# Patient Record
Sex: Female | Born: 1975 | Race: White | Hispanic: No | Marital: Married | Smoking: Never smoker
Health system: Southern US, Community
[De-identification: ages and names within clinical notes are randomized; demographics above are authoritative.]

## PROBLEM LIST (undated history)

## (undated) DIAGNOSIS — F32A Depression, unspecified: Secondary | ICD-10-CM

## (undated) DIAGNOSIS — T7840XA Allergy, unspecified, initial encounter: Secondary | ICD-10-CM

## (undated) DIAGNOSIS — R079 Chest pain, unspecified: Secondary | ICD-10-CM

## (undated) DIAGNOSIS — M1990 Unspecified osteoarthritis, unspecified site: Secondary | ICD-10-CM

## (undated) DIAGNOSIS — R0609 Other forms of dyspnea: Secondary | ICD-10-CM

## (undated) DIAGNOSIS — B019 Varicella without complication: Secondary | ICD-10-CM

## (undated) DIAGNOSIS — R0600 Dyspnea, unspecified: Secondary | ICD-10-CM

## (undated) DIAGNOSIS — I341 Nonrheumatic mitral (valve) prolapse: Secondary | ICD-10-CM

## (undated) DIAGNOSIS — I471 Supraventricular tachycardia, unspecified: Secondary | ICD-10-CM

## (undated) DIAGNOSIS — J45909 Unspecified asthma, uncomplicated: Secondary | ICD-10-CM

## (undated) DIAGNOSIS — Q676 Pectus excavatum: Secondary | ICD-10-CM

## (undated) DIAGNOSIS — R011 Cardiac murmur, unspecified: Secondary | ICD-10-CM

## (undated) DIAGNOSIS — F329 Major depressive disorder, single episode, unspecified: Secondary | ICD-10-CM

## (undated) HISTORY — DX: Chest pain, unspecified: R07.9

## (undated) HISTORY — DX: Unspecified asthma, uncomplicated: J45.909

## (undated) HISTORY — DX: Dyspnea, unspecified: R06.00

## (undated) HISTORY — DX: Nonrheumatic mitral (valve) prolapse: I34.1

## (undated) HISTORY — DX: Cardiac murmur, unspecified: R01.1

## (undated) HISTORY — DX: Allergy, unspecified, initial encounter: T78.40XA

## (undated) HISTORY — DX: Other forms of dyspnea: R06.09

## (undated) HISTORY — DX: Supraventricular tachycardia: I47.1

## (undated) HISTORY — DX: Depression, unspecified: F32.A

## (undated) HISTORY — DX: Unspecified osteoarthritis, unspecified site: M19.90

## (undated) HISTORY — DX: Major depressive disorder, single episode, unspecified: F32.9

## (undated) HISTORY — DX: Supraventricular tachycardia, unspecified: I47.10

## (undated) HISTORY — DX: Varicella without complication: B01.9

## (undated) HISTORY — DX: Pectus excavatum: Q67.6

## (undated) HISTORY — PX: SHOULDER SURGERY: SHX246

---

## 1998-08-08 HISTORY — PX: FRACTURE SURGERY: SHX138

## 2012-09-30 LAB — HM PAP SMEAR: HM Pap smear: NORMAL

## 2012-12-09 DIAGNOSIS — J45909 Unspecified asthma, uncomplicated: Secondary | ICD-10-CM | POA: Insufficient documentation

## 2013-09-23 DIAGNOSIS — M19011 Primary osteoarthritis, right shoulder: Secondary | ICD-10-CM | POA: Insufficient documentation

## 2015-08-18 ENCOUNTER — Ambulatory Visit (INDEPENDENT_AMBULATORY_CARE_PROVIDER_SITE_OTHER): Payer: 59 | Admitting: Internal Medicine

## 2015-08-18 ENCOUNTER — Encounter: Payer: Self-pay | Admitting: Internal Medicine

## 2015-08-18 VITALS — BP 104/68 | HR 69 | Temp 98.5°F | Resp 12 | Ht 64.75 in | Wt 139.5 lb

## 2015-08-18 DIAGNOSIS — Z8659 Personal history of other mental and behavioral disorders: Secondary | ICD-10-CM

## 2015-08-18 DIAGNOSIS — E559 Vitamin D deficiency, unspecified: Secondary | ICD-10-CM | POA: Diagnosis not present

## 2015-08-18 DIAGNOSIS — M25511 Pain in right shoulder: Secondary | ICD-10-CM

## 2015-08-18 DIAGNOSIS — Z975 Presence of (intrauterine) contraceptive device: Secondary | ICD-10-CM | POA: Diagnosis not present

## 2015-08-18 DIAGNOSIS — G8929 Other chronic pain: Secondary | ICD-10-CM

## 2015-08-18 DIAGNOSIS — Z8679 Personal history of other diseases of the circulatory system: Secondary | ICD-10-CM | POA: Diagnosis not present

## 2015-08-18 DIAGNOSIS — R5383 Other fatigue: Secondary | ICD-10-CM

## 2015-08-18 DIAGNOSIS — I341 Nonrheumatic mitral (valve) prolapse: Secondary | ICD-10-CM | POA: Diagnosis not present

## 2015-08-18 DIAGNOSIS — E785 Hyperlipidemia, unspecified: Secondary | ICD-10-CM

## 2015-08-18 DIAGNOSIS — Q676 Pectus excavatum: Secondary | ICD-10-CM
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
## 2015-08-18 NOTE — Patient Instructions (Signed)
It was great to meet you!  I'll see  you in a few weeks for your annual CPE with PAP smear  We'll get your fasting labs done prior to visit  And schedule the mammogram afterward

## 2015-08-18 NOTE — Progress Notes (Signed)
Pre-visit discussion using our clinic review tool. No additional management support is needed unless otherwise documented below in the visit note.  

## 2015-08-21 DIAGNOSIS — R5383 Other fatigue: Secondary | ICD-10-CM | POA: Insufficient documentation

## 2015-08-21 DIAGNOSIS — M25511 Pain in right shoulder: Secondary | ICD-10-CM

## 2015-08-21 DIAGNOSIS — E559 Vitamin D deficiency, unspecified: Secondary | ICD-10-CM | POA: Insufficient documentation

## 2015-08-21 DIAGNOSIS — E785 Hyperlipidemia, unspecified: Secondary | ICD-10-CM | POA: Insufficient documentation

## 2015-08-21 DIAGNOSIS — Z8659 Personal history of other mental and behavioral disorders: Secondary | ICD-10-CM | POA: Insufficient documentation

## 2015-08-21 DIAGNOSIS — Q676 Pectus excavatum: Secondary | ICD-10-CM | POA: Insufficient documentation

## 2015-08-21 DIAGNOSIS — G8929 Other chronic pain: Secondary | ICD-10-CM | POA: Insufficient documentation

## 2015-08-21 NOTE — Assessment & Plan Note (Addendum)
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  Continue celebrex , turmeric and tylenol

## 2015-08-21 NOTE — Assessment & Plan Note (Signed)
It is unclear what the best way to image her right breast for breast cancer screening is. Will discuss at her annual exam.

## 2015-08-21 NOTE — Assessment & Plan Note (Addendum)
Chronic, managed for years with fluoxetine. No changes today

## 2015-08-21 NOTE — Assessment & Plan Note (Signed)
She has no murmur on exam today.

## 2015-09-29 ENCOUNTER — Other Ambulatory Visit
Admission: RE | Admit: 2015-09-29 | Discharge: 2015-09-29 | Disposition: A | Discharge status: Home / Self Care | Payer: 59 | Source: Ambulatory Visit | Attending: Internal Medicine | Admitting: Internal Medicine

## 2015-09-29 DIAGNOSIS — E785 Hyperlipidemia, unspecified: Secondary | ICD-10-CM | POA: Diagnosis not present

## 2015-09-29 DIAGNOSIS — R5383 Other fatigue: Secondary | ICD-10-CM | POA: Insufficient documentation

## 2015-09-29 DIAGNOSIS — E559 Vitamin D deficiency, unspecified: Secondary | ICD-10-CM | POA: Insufficient documentation

## 2015-09-29 LAB — COMPREHENSIVE METABOLIC PANEL
ALBUMIN: 4.2 g/dL (ref 3.5–5.0)
ALT: 25 U/L (ref 14–54)
ANION GAP: 3 — AB (ref 5–15)
AST: 23 U/L (ref 15–41)
Alkaline Phosphatase: 57 U/L (ref 38–126)
BUN: 17 mg/dL (ref 6–20)
CHLORIDE: 107 mmol/L (ref 101–111)
CO2: 30 mmol/L (ref 22–32)
CREATININE: 0.72 mg/dL (ref 0.44–1.00)
Calcium: 9.1 mg/dL (ref 8.9–10.3)
GFR calc non Af Amer: >60 mL/min (ref >60)
Glucose, Bld: 96 mg/dL (ref 65–99)
Potassium: 4.9 mmol/L (ref 3.5–5.1)
SODIUM: 140 mmol/L (ref 135–145)
Total Bilirubin: 1.4 mg/dL — ABNORMAL HIGH (ref 0.3–1.2)
Total Protein: 7.2 g/dL (ref 6.5–8.1)

## 2015-09-29 LAB — CBC WITH DIFFERENTIAL/PLATELET
Basophils Absolute: 0 10*3/uL (ref 0–0.1)
Basophils Relative: 1 %
Eosinophils Absolute: 0.1 10*3/uL (ref 0–0.7)
Eosinophils Relative: 3 %
HCT: 44.1 % (ref 35.0–47.0)
HEMOGLOBIN: 15.3 g/dL (ref 12.0–16.0)
LYMPHS ABS: 1.2 10*3/uL (ref 1.0–3.6)
Lymphocytes Relative: 22 %
MCH: 31.1 pg (ref 26.0–34.0)
MCHC: 34.7 g/dL (ref 32.0–36.0)
MCV: 89.8 fL (ref 80.0–100.0)
MONOS PCT: 7 %
Monocytes Absolute: 0.4 10*3/uL (ref 0.2–0.9)
NEUTROS PCT: 67 %
Neutro Abs: 3.7 10*3/uL (ref 1.4–6.5)
Platelets: 175 10*3/uL (ref 150–440)
RBC: 4.92 MIL/uL (ref 3.80–5.20)
RDW: 11.9 % (ref 11.5–14.5)
WBC: 5.5 10*3/uL (ref 3.6–11.0)

## 2015-09-29 LAB — LIPID PANEL
Cholesterol: 162 mg/dL (ref 0–200)
HDL: 51 mg/dL (ref >40)
LDL CALC: 102 mg/dL — AB (ref 0–99)
Total CHOL/HDL Ratio: 3.2 RATIO
Triglycerides: 46 mg/dL (ref <150)
VLDL: 9 mg/dL (ref 0–40)

## 2015-09-29 LAB — TSH: TSH: 1.74 u[IU]/mL (ref 0.350–4.500)

## 2015-09-30 LAB — VITAMIN D 25 HYDROXY (VIT D DEFICIENCY, FRACTURES): Vit D, 25-Hydroxy: 36.1 ng/mL (ref 30.0–100.0)

## 2015-09-30 LAB — T4: T4, Total: 7.7 ug/dL (ref 4.5–12.0)

## 2015-10-01 ENCOUNTER — Encounter: Payer: Self-pay | Admitting: Internal Medicine

## 2015-10-01 ENCOUNTER — Ambulatory Visit (INDEPENDENT_AMBULATORY_CARE_PROVIDER_SITE_OTHER): Payer: 59 | Admitting: Internal Medicine

## 2015-10-01 ENCOUNTER — Other Ambulatory Visit (HOSPITAL_COMMUNITY)
Admission: RE | Admit: 2015-10-01 | Discharge: 2015-10-01 | Disposition: A | Discharge status: Home / Self Care | Payer: 59 | Source: Ambulatory Visit | Attending: Internal Medicine | Admitting: Internal Medicine

## 2015-10-01 VITALS — BP 96/68 | HR 74 | Temp 98.1°F | Resp 12 | Ht 65.0 in | Wt 139.5 lb

## 2015-10-01 DIAGNOSIS — Z803 Family history of malignant neoplasm of breast: Secondary | ICD-10-CM | POA: Diagnosis not present

## 2015-10-01 DIAGNOSIS — Z124 Encounter for screening for malignant neoplasm of cervix: Secondary | ICD-10-CM

## 2015-10-01 DIAGNOSIS — Z1151 Encounter for screening for human papillomavirus (HPV): Secondary | ICD-10-CM | POA: Diagnosis not present

## 2015-10-01 DIAGNOSIS — Z872 Personal history of diseases of the skin and subcutaneous tissue: Secondary | ICD-10-CM | POA: Diagnosis not present

## 2015-10-01 DIAGNOSIS — Z01419 Encounter for gynecological examination (general) (routine) without abnormal findings: Secondary | ICD-10-CM | POA: Insufficient documentation

## 2015-10-01 DIAGNOSIS — Z0000 Encounter for general adult medical examination without abnormal findings: Secondary | ICD-10-CM | POA: Diagnosis not present

## 2015-10-01 DIAGNOSIS — Z86018 Personal history of other benign neoplasm: Secondary | ICD-10-CM

## 2015-10-01 NOTE — Progress Notes (Signed)
Patient ID: Sarah Pratt, female    DOB: 05/20/1975  Age: 40 y.o. MRN: [id number]  The patient is here for annual CPE with PAP  and management of other chronic and acute problems.   The risk factors are reflected in the social history. She has not had a mammogram in 2 years,  Her pectus excavatum made imaging of the right breast difficult to image using traditional mammography per [facility] and her insurance  would not cover a CT.  Her risk of BRCA is elevated due to a FH of BRCA in a maternal aunt.  Her mother has deferred breast cancer screening.   The roster of all physicians providing medical care to patient - is listed in the Snapshot section of the chart.  Home safety : The patient has smoke detectors in the home. They wear seatbelts.  There are no firearms at home. There is no violence in the home.   There is no risks for hepatitis, STDs or HIV. There is no   history of blood transfusion. They have no travel history to infectious disease endemic areas of the world.  The patient has seen their dentist in the last six month. They have not had an eye exam in the last year but defer testing as she has not had any vision change.s   They do not  have excessive sun exposure. Discussed the need for sun protection: hats, long sleeves and use of sunscreen if there is significant sun exposure.   Diet: the importance of a healthy diet is discussed. They do have a healthy diet.  The benefits of regular aerobic exercise were discussed. She exercises at a gym  times per week ,  30 minutes.   Depression screen: there are no signs or vegative symptoms of depression- irritability, change in appetite, anhedonia, sadness/tearfullness.   The following portions of the patient's history were reviewed and updated as appropriate: allergies, current medications, past family history, past medical history,  past surgical history, past social history  and problem list.  Visual acuity was not assessed per  patient preference since she has regular follow up with her ophthalmologist. Hearing and body mass index were assessed and reviewed.   During the course of the visit the patient was educated and counseled about appropriate screening and preventive services including : fall prevention , diabetes screening, nutrition counseling, colorectal cancer screening, and recommended immunizations.    CC: The primary encounter diagnosis was Encounter for preventive health examination. Diagnoses of Screening for cervical cancer, Family history of breast cancer in female, and History of dysplastic nevus were also pertinent to this visit.  History Sharlett has a past medical history of Asthma; Depression; Chicken pox; Allergy; Cardiac arrhythmia due to congenital heart disease; Heart murmur; SVT (supraventricular tachycardia) ([place]); and Congenital pectus excavatum.   She has past surgical history that includes Fracture surgery (Right).   Her family history includes Arthritis in her father; Diabetes (age of onset: 51) in her mother; Hyperlipidemia in her father and mother; Multiple sclerosis (age of onset: 42) in her sister; Parkinson's disease (age of onset: 81) in her father; Rheum arthritis (age of onset: 77) in her sister.She reports that she has never smoked. She has never used smokeless tobacco. She reports that she does not drink alcohol or use illicit drugs.  Outpatient Prescriptions Prior to Visit  Medication Sig Dispense Refill  . celecoxib (CELEBREX) 200 MG capsule Take 200 mg by mouth daily.    . Cholecalciferol (D 1000) 1000  units capsule Take 1,000 Units by mouth daily.    Marland Kitchen FLUoxetine (PROZAC) 40 MG capsule Take 1 capsule by mouth daily.  3  . fluticasone (FLONASE) 50 MCG/ACT nasal spray Place 2 sprays into both nostrils daily.    Marland Kitchen levonorgestrel (MIRENA) 20 MCG/24HR IUD by Intrauterine route.    . Turmeric (RA TURMERIC) 500 MG CAPS Take 1,000 mg by mouth daily.     No facility-administered  medications prior to visit.    Review of Systems  Patient denies headache, fevers, malaise, unintentional weight loss, skin rash, eye pain, sinus congestion and sinus pain, sore throat, dysphagia,  hemoptysis , cough, dyspnea, wheezing, chest pain, palpitations, orthopnea, edema, abdominal pain, nausea, melena, diarrhea, constipation, flank pain, dysuria, hematuria, urinary  Frequency, nocturia, numbness, tingling, seizures,  Focal weakness, Loss of consciousness,  Tremor, insomnia, depression, anxiety, and suicidal ideation.     Objective:  BP 96/68 mmHg  Pulse 74  Temp(Src) 98.1 F (36.7 C) (Oral)  Resp 12  Ht _0  (1.651 m)  Wt 139 lb 8 oz (63.277 kg)  BMI 23.21 kg/m2  SpO2 98%  Physical Exam   General Appearance:    Alert, cooperative, no distress, appears stated age  Head:    Normocephalic, without obvious abnormality, atraumatic  Eyes:    PERRL, conjunctiva/corneas clear, EOM's intact, fundi    benign, both eyes  Ears:    Normal TM's and external ear canals, both ears  Nose:   Nares normal, septum midline, mucosa normal, no drainage    or sinus tenderness  Throat:   Lips, mucosa, and tongue normal; teeth and gums normal  Neck:   Supple, symmetrical, trachea midline, no adenopathy;    thyroid:  no enlargement/tenderness/nodules; no carotid   bruit or JVD  Back:     Symmetric, no curvature, ROM normal, no CVA tenderness  Lungs:     Clear to auscultation bilaterally, respirations unlabored  Chest Wall:    No tenderness. Pectus excavatum deformity   Heart:    Regular rate and rhythm, S1 and S2 normal, no murmur, rub   or gallop  Breast Exam:    No tenderness, masses, or nipple abnormality  Abdomen:     Soft, non-tender, bowel sounds active all four quadrants,    no masses, no organomegaly  Genitalia:    Pelvic: cervix normal in appearance, external genitalia normal, no adnexal masses or tenderness, no cervical motion tenderness, IUD string visible from os, rectovaginal  septum normal, uterus normal size, shape, and consistency and vagina normal without discharge  Extremities:   Extremities normal, atraumatic, no cyanosis or edema  Pulses:   2+ and symmetric all extremities  Skin:   Skin color, texture, turgor normal, no rashes or lesions  Lymph nodes:   Cervical, supraclavicular, and axillary nodes normal  Neurologic:   CNII-XII intact, normal strength, sensation and reflexes    throughout      Assessment & Plan:   Problem List Items Addressed This Visit    Encounter for preventive health examination - Primary    Annual comprehensive preventive exam was done as well as an evaluation and management of chronic conditions .  During the course of the visit the patient was educated and counseled about appropriate screening and preventive services including :  diabetes screening, lipid analysis with projected  10 year  risk for CAD , nutrition counseling, breast, cervical  cancer screening, and recommended immunizations.  Printed recommendations for health maintenance screenings was give  Family history of breast cancer in female    Her maternal aunt was diagnosed at 74.  Her mother has deferred screening .  She is unable to have standard screening due to pectus excavatum  Of right breast.  CT scan was recommended by [facility] but deferred due to cost .  It is unknown if there are other options that may be covered by her insurance. Will discuss with Dr Bary Castilla.       History of dysplastic nevus    Referral to Dr Loreli Dollar for annual skin check given history of  Dysplastic nevus.       Relevant Orders   Ambulatory referral to Dermatology    Other Visit Diagnoses    Screening for cervical cancer        Relevant Orders    Cytology - PAP       I am having Ms. Olberding maintain her levonorgestrel, fluticasone, FLUoxetine, Cholecalciferol, Turmeric, and celecoxib.  No orders of the defined types were placed in this encounter.    There are no  discontinued medications.  Follow-up: No Follow-up on file.   [name], MD

## 2015-10-01 NOTE — Progress Notes (Signed)
Pre-visit discussion using our clinic review tool. No additional management support is needed unless otherwise documented below in the visit note.  

## 2015-10-01 NOTE — Patient Instructions (Signed)
I recommend Sarah Pratt of [facility] on [place] follow up on history of dysplastic nevus .  If you need a referral , just let me know .  I will speak to Dr Bary Castilla and one of the [facility] radiologists about how to best image your right breast   Health Maintenance, Female Adopting a healthy lifestyle and getting preventive care can go a long way to promote health and wellness. Talk with your health care provider about what schedule of regular examinations is right for you. This is a good chance for you to check in with your provider about disease prevention and staying healthy. In between checkups, there are plenty of things you can do on your own. Experts have done a lot of research about which lifestyle changes and preventive measures are most likely to keep you healthy. Ask your health care provider for more information. WEIGHT AND DIET  Eat a healthy diet  Be sure to include plenty of vegetables, fruits, low-fat dairy products, and lean protein.  Do not eat a lot of foods high in solid fats, added sugars, or salt.  Get regular exercise. This is one of the most important things you can do for your health.  Most adults should exercise for at least 150 minutes each week. The exercise should increase your heart rate and make you sweat (moderate-intensity exercise).  Most adults should also do strengthening exercises at least twice a week. This is in addition to the moderate-intensity exercise.  Maintain a healthy weight  Body mass index (BMI) is a measurement that can be used to identify possible weight problems. It estimates body fat based on height and weight. Your health care provider can help determine your BMI and help you achieve or maintain a healthy weight.  For females 60 years of age and older:   A BMI below 18.5 is considered underweight.  A BMI of 18.5 to 24.9 is normal.  A BMI of 25 to 29.9 is considered overweight.  A BMI of 30 and above is considered  obese.  Watch levels of cholesterol and blood lipids  You should start having your blood tested for lipids and cholesterol at 42 years of age, then have this test every 5 years.  You may need to have your cholesterol levels checked more often if:  Your lipid or cholesterol levels are high.  You are older than 42 years of age.  You are at high risk for heart disease.  CANCER SCREENING   Lung Cancer  Lung cancer screening is recommended for adults 74-105 years old who are at high risk for lung cancer because of a history of smoking.  A yearly low-dose CT scan of the lungs is recommended for people who:  Currently smoke.  Have quit within the past 15 years.  Have at least a 30-pack-year history of smoking. A pack year is smoking an average of one pack of cigarettes a day for 1 year.  Yearly screening should continue until it has been 15 years since you quit.  Yearly screening should stop if you develop a health problem that would prevent you from having lung cancer treatment.  Breast Cancer  Practice breast self-awareness. This means understanding how your breasts normally appear and feel.  It also means doing regular breast self-exams. Let your health care provider know about any changes, no matter how small.  If you are in your 20s or 30s, you should have a clinical breast exam (CBE) by a health care  provider every 1-3 years as part of a regular health exam.  If you are 40 or older, have a CBE every year. Also consider having a breast X-ray (mammogram) every year.  If you have a family history of breast cancer, talk to your health care provider about genetic screening.  If you are at high risk for breast cancer, talk to your health care provider about having an MRI and a mammogram every year.  Breast cancer gene (BRCA) assessment is recommended for women who have family members with BRCA-related cancers. BRCA-related cancers  include:  Breast.  Ovarian.  Tubal.  Peritoneal cancers.  Results of the assessment will determine the need for genetic counseling and BRCA1 and BRCA2 testing. Cervical Cancer Your health care provider may recommend that you be screened regularly for cancer of the pelvic organs (ovaries, uterus, and vagina). This screening involves a pelvic examination, including checking for microscopic changes to the surface of your cervix (Pap test). You may be encouraged to have this screening done every 3 years, beginning at age 21.  For women ages 30-65, health care providers may recommend pelvic exams and Pap testing every 3 years, or they may recommend the Pap and pelvic exam, combined with testing for human papilloma virus (HPV), every 5 years. Some types of HPV increase your risk of cervical cancer. Testing for HPV may also be done on women of any age with unclear Pap test results.  Other health care providers may not recommend any screening for nonpregnant women who are considered low risk for pelvic cancer and who do not have symptoms. Ask your health care provider if a screening pelvic exam is right for you.  If you have had past treatment for cervical cancer or a condition that could lead to cancer, you need Pap tests and screening for cancer for at least 20 years after your treatment. If Pap tests have been discontinued, your risk factors (such as having a new sexual partner) need to be reassessed to determine if screening should resume. Some women have medical problems that increase the chance of getting cervical cancer. In these cases, your health care provider may recommend more frequent screening and Pap tests. Colorectal Cancer  This type of cancer can be detected and often prevented.  Routine colorectal cancer screening usually begins at 42 years of age and continues through 42 years of age.  Your health care provider may recommend screening at an earlier age if you have risk factors for  colon cancer.  Your health care provider may also recommend using home test kits to check for hidden blood in the stool.  A small camera at the end of a tube can be used to examine your colon directly (sigmoidoscopy or colonoscopy). This is done to check for the earliest forms of colorectal cancer.  Routine screening usually begins at age 50.  Direct examination of the colon should be repeated every 5-10 years through 42 years of age. However, you may need to be screened more often if early forms of precancerous polyps or small growths are found. Skin Cancer  Check your skin from head to toe regularly.  Tell your health care provider about any new moles or changes in moles, especially if there is a change in a mole's shape or color.  Also tell your health care provider if you have a mole that is larger than the size of a pencil eraser.  Always use sunscreen. Apply sunscreen liberally and repeatedly throughout the day.  Protect yourself   by wearing long sleeves, pants, a wide-brimmed hat, and sunglasses whenever you are outside. HEART DISEASE, DIABETES, AND HIGH BLOOD PRESSURE   High blood pressure causes heart disease and increases the risk of stroke. High blood pressure is more likely to develop in:  People who have blood pressure in the high end of the normal range (130-139/85-89 mm Hg).  People who are overweight or obese.  People who are African American.  If you are 69-2 years of age, have your blood pressure checked every 3-5 years. If you are 13 years of age or older, have your blood pressure checked every year. You should have your blood pressure measured twice--once when you are at a hospital or clinic, and once when you are not at a hospital or clinic. Record the average of the two measurements. To check your blood pressure when you are not at a hospital or clinic, you can use:  An automated blood pressure machine at a pharmacy.  A home blood pressure monitor.  If you  are between 66 years and 34 years old, ask your health care provider if you should take aspirin to prevent strokes.  Have regular diabetes screenings. This involves taking a blood sample to check your fasting blood sugar level.  If you are at a normal weight and have a low risk for diabetes, have this test once every three years after 42 years of age.  If you are overweight and have a high risk for diabetes, consider being tested at a younger age or more often. PREVENTING INFECTION  Hepatitis B  If you have a higher risk for hepatitis B, you should be screened for this virus. You are considered at high risk for hepatitis B if:  You were born in a country where hepatitis B is common. Ask your health care provider which countries are considered high risk.  Your parents were born in a high-risk country, and you have not been immunized against hepatitis B (hepatitis B vaccine).  You have HIV or AIDS.  You use needles to inject street drugs.  You live with someone who has hepatitis B.  You have had sex with someone who has hepatitis B.  You get hemodialysis treatment.  You take certain medicines for conditions, including cancer, organ transplantation, and autoimmune conditions. Hepatitis C  Blood testing is recommended for:  Everyone born from [birth year] through 1967.  Anyone with known risk factors for hepatitis C. Sexually transmitted infections (STIs)  You should be screened for sexually transmitted infections (STIs) including gonorrhea and chlamydia if:  You are sexually active and are younger than 42 years of age.  You are older than 42 years of age and your health care provider tells you that you are at risk for this type of infection.  Your sexual activity has changed since you were last screened and you are at an increased risk for chlamydia or gonorrhea. Ask your health care provider if you are at risk.  If you do not have HIV, but are at risk, it may be recommended that you  take a prescription medicine daily to prevent HIV infection. This is called pre-exposure prophylaxis (PrEP). You are considered at risk if:  You are sexually active and do not regularly use condoms or know the HIV status of your partner(s).  You take drugs by injection.  You are sexually active with a partner who has HIV. Talk with your health care provider about whether you are at high risk of being infected with HIV.  If you choose to begin PrEP, you should first be tested for HIV. You should then be tested every 3 months for as long as you are taking PrEP.  PREGNANCY   If you are premenopausal and you may become pregnant, ask your health care provider about preconception counseling.  If you may become pregnant, take 400 to 800 micrograms (mcg) of folic acid every day.  If you want to prevent pregnancy, talk to your health care provider about birth control (contraception). OSTEOPOROSIS AND MENOPAUSE   Osteoporosis is a disease in which the bones lose minerals and strength with aging. This can result in serious bone fractures. Your risk for osteoporosis can be identified using a bone density scan.  If you are 28 years of age or older, or if you are at risk for osteoporosis and fractures, ask your health care provider if you should be screened.  Ask your health care provider whether you should take a calcium or vitamin D supplement to lower your risk for osteoporosis.  Menopause may have certain physical symptoms and risks.  Hormone replacement therapy may reduce some of these symptoms and risks. Talk to your health care provider about whether hormone replacement therapy is right for you.  HOME CARE INSTRUCTIONS   Schedule regular health, dental, and eye exams.  Stay current with your immunizations.   Do not use any tobacco products including cigarettes, chewing tobacco, or electronic cigarettes.  If you are pregnant, do not drink alcohol.  If you are breastfeeding, limit how  much and how often you drink alcohol.  Limit alcohol intake to no more than 1 drink per day for nonpregnant women. One drink equals 12 ounces of beer, 5 ounces of wine, or 1 ounces of hard liquor.  Do not use street drugs.  Do not share needles.  Ask your health care provider for help if you need support or information about quitting drugs.  Tell your health care provider if you often feel depressed.  Tell your health care provider if you have ever been abused or do not feel safe at home.   This information is not intended to replace advice given to you by your health care provider. Make sure you discuss any questions you have with your health care provider.   Document Released: 11/08/2010 Document Revised: 05/16/2014 Document Reviewed: 03/27/2013 Elsevier Interactive Patient Education [employer].

## 2015-10-03 DIAGNOSIS — Z0001 Encounter for general adult medical examination with abnormal findings: Secondary | ICD-10-CM | POA: Insufficient documentation

## 2015-10-03 DIAGNOSIS — Z803 Family history of malignant neoplasm of breast: Secondary | ICD-10-CM | POA: Insufficient documentation

## 2015-10-03 DIAGNOSIS — Z86018 Personal history of other benign neoplasm: Secondary | ICD-10-CM | POA: Insufficient documentation

## 2015-10-03 DIAGNOSIS — Z0000 Encounter for general adult medical examination without abnormal findings: Secondary | ICD-10-CM | POA: Insufficient documentation

## 2015-10-03 NOTE — Assessment & Plan Note (Signed)
Annual comprehensive preventive exam was done as well as an evaluation and management of chronic conditions .  During the course of the visit the patient was educated and counseled about appropriate screening and preventive services including :  diabetes screening, lipid analysis with projected  10 year  risk for CAD , nutrition counseling, breast, cervical  cancer screening, and recommended immunizations.  Printed recommendations for health maintenance screenings was give

## 2015-10-03 NOTE — Assessment & Plan Note (Signed)
Referral to Dr Loreli Dollar for annual skin check given history of  Dysplastic nevus.

## 2015-10-03 NOTE — Assessment & Plan Note (Signed)
Her maternal aunt was diagnosed at 45.  Her mother has deferred screening .  She is unable to have standard screening due to pectus excavatum  Of right breast.  CT scan was recommended by [facility] but deferred due to cost .  It is unknown if there are other options that may be covered by her insurance. Will discuss with Dr Bary Castilla.

## 2015-10-06 LAB — CYTOLOGY - PAP

## 2015-10-09 ENCOUNTER — Encounter: Payer: Self-pay | Admitting: Internal Medicine

## 2015-10-13 ENCOUNTER — Encounter: Payer: Self-pay | Admitting: Internal Medicine

## 2015-10-23 NOTE — Telephone Encounter (Signed)
Mailed unread message to patient. thanks 

## 2015-10-26 ENCOUNTER — Other Ambulatory Visit: Payer: Self-pay | Admitting: Internal Medicine

## 2015-10-26 ENCOUNTER — Telehealth: Payer: Self-pay | Admitting: Internal Medicine

## 2015-10-26 DIAGNOSIS — Z1239 Encounter for other screening for malignant neoplasm of breast: Secondary | ICD-10-CM

## 2015-10-26 DIAGNOSIS — Z803 Family history of malignant neoplasm of breast: Secondary | ICD-10-CM

## 2015-10-26 NOTE — Telephone Encounter (Signed)
Patient notified

## 2015-10-26 NOTE — Telephone Encounter (Signed)
Dr Bary Castilla said he would be happy to see her to discuss alternatives to mammogram foe her history of pectus excavatum and maternal aunt with BRCA . He has ultrasoudn in his office. i set up the referral

## 2015-10-27 ENCOUNTER — Encounter: Payer: Self-pay | Admitting: *Deleted

## 2015-11-17 ENCOUNTER — Ambulatory Visit (INDEPENDENT_AMBULATORY_CARE_PROVIDER_SITE_OTHER): Payer: 59 | Admitting: General Surgery

## 2015-11-17 ENCOUNTER — Encounter: Payer: Self-pay | Admitting: General Surgery

## 2015-11-17 VITALS — BP 92/56 | HR 66 | Resp 12 | Ht 66.0 in | Wt 144.0 lb

## 2015-11-17 DIAGNOSIS — Z1231 Encounter for screening mammogram for malignant neoplasm of breast: Secondary | ICD-10-CM | POA: Insufficient documentation

## 2015-11-17 DIAGNOSIS — Q676 Pectus excavatum: Secondary | ICD-10-CM | POA: Diagnosis not present

## 2015-11-17 NOTE — Progress Notes (Signed)
Patient ID: Sarah Pratt, female   DOB: 10-26-1975, 40 y.o.   MRN: [id number]  Chief Complaint  Patient presents with  . Other    breast evaluation    HPI Sarah Pratt is a 40 y.o. female.  who presents for a breast evaluation. The most recent baseline mammogram was done in 2011 at [facility] in [facility] and they were unable to obtain a right mammogram at that timebecause of chest wall abnormalities related to her pectus excavatum. The patient has 2 children, daughter 57 and a son 33. She reports nursing both. The left breast was preferred with minimal milk production on the right. No episodes of mastitis reported.  Patient does perform regular self breast checks. She wants to talk about options for mammograms since she has congenital pectus excavatum.  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   HPI  Past Medical History  Diagnosis Date  . Asthma   . Depression   . Chicken pox   . Allergy   . Cardiac arrhythmia due to congenital heart disease   . SVT (supraventricular tachycardia) ([place])   . Congenital pectus excavatum   . Arthritis   . Heart murmur     mitral valve prolapse    Past Surgical History  Procedure Laterality Date  . Fracture surgery Right 08/1998    shoulder fracture  . Shoulder surgery Right 08/1998, 02/1999, 09/2013    Family History  Problem Relation Age of Onset  . Hyperlipidemia Mother   . Diabetes Mother 51    late type 2  . Arthritis Father     rheumatoid   . Hyperlipidemia Father   . Parkinson's disease Father 42    deceased  . Multiple sclerosis Sister 71  . Rheum arthritis Sister 28  . Breast cancer Maternal Aunt     ? 50's  . Colon cancer Maternal Grandmother     ? 64's and bladder cancer    Social History Social History  Substance Use Topics  . Smoking status: Never Smoker   . Smokeless tobacco: Never Used  . Alcohol Use: No    No Known Allergies  Current  Outpatient Prescriptions  Medication Sig Dispense Refill  . celecoxib (CELEBREX) 200 MG capsule Take 200 mg by mouth daily.    . Cholecalciferol (D 1000) 1000 units capsule Take 1,000 Units by mouth daily.    Marland Kitchen FLUoxetine (PROZAC) 40 MG capsule Take 1 capsule by mouth daily.  3  . fluticasone (FLONASE) 50 MCG/ACT nasal spray Place 2 sprays into both nostrils daily.    Marland Kitchen levonorgestrel (MIRENA) 20 MCG/24HR IUD by Intrauterine route.    . Turmeric (RA TURMERIC) 500 MG CAPS Take 1,000 mg by mouth daily.     No current facility-administered medications for this visit.    Review of Systems Review of Systems  Constitutional: Negative.   Respiratory: Negative.   Cardiovascular: Negative.     Blood pressure 92/56, pulse 66, resp. rate 12, height 5\' 6"  (1.676 m), weight 144 lb (65.318 kg).  Physical Exam Physical Exam  Constitutional: She is oriented to person, place, and time. She appears well-developed and well-nourished.  HENT:  Mouth/Throat: Oropharynx is clear and moist.  Eyes: Conjunctivae are normal. No scleral icterus.  Neck: Neck supple.  Cardiovascular: Normal rate, regular rhythm and normal heart sounds.   Pulmonary/Chest: Effort normal and breath sounds normal. Right breast exhibits no inverted nipple, no mass, no  nipple discharge, no skin change and no tenderness. Left breast exhibits no inverted nipple, no mass, no nipple discharge, no skin change and no tenderness.    No palpable breast abnormality.  Lymphadenopathy:    She has no cervical adenopathy.    She has no axillary adenopathy.  Neurological: She is alert and oriented to person, place, and time.  Skin: Skin is warm and dry.  Psychiatric: Her behavior is normal.    Data Reviewed None.  Assessment    Benign breast exam.    Plan    I spoke with the breast mammography physician on duty. With no first-degree family history, attempts at repeat screening mammograms would be the best first step.     The  patient will be asked to have a bilateral screening mammogram, she will call the office the day of the exam so the films can be independently reviewed.  PCP:  Deborra Medina  This information has been scribed by Karie Fetch RN, BSN,BC.   Robert Bellow 11/17/2015, 9:38 PM

## 2015-11-17 NOTE — Patient Instructions (Signed)
The patient will be asked to have a bilateral screening mammogram, she will call the office for results.

## 2015-11-26 ENCOUNTER — Other Ambulatory Visit: Payer: Self-pay | Admitting: *Deleted

## 2015-11-26 ENCOUNTER — Inpatient Hospital Stay
Admission: RE | Admit: 2015-11-26 | Discharge: 2015-11-26 | Disposition: A | Discharge status: Home / Self Care | Payer: Self-pay | Source: Ambulatory Visit | Attending: *Deleted | Admitting: *Deleted

## 2015-11-26 DIAGNOSIS — Z9289 Personal history of other medical treatment: Secondary | ICD-10-CM

## 2015-12-15 ENCOUNTER — Ambulatory Visit: Payer: 59

## 2015-12-29 ENCOUNTER — Encounter: Payer: Self-pay | Admitting: General Surgery

## 2015-12-29 ENCOUNTER — Ambulatory Visit
Admission: RE | Admit: 2015-12-29 | Discharge: 2015-12-29 | Disposition: A | Discharge status: Home / Self Care | Payer: 59 | Source: Ambulatory Visit | Attending: General Surgery | Admitting: General Surgery

## 2015-12-29 ENCOUNTER — Other Ambulatory Visit: Payer: Self-pay | Admitting: General Surgery

## 2015-12-29 ENCOUNTER — Telehealth: Payer: Self-pay | Admitting: *Deleted

## 2015-12-29 DIAGNOSIS — Z1231 Encounter for screening mammogram for malignant neoplasm of breast: Secondary | ICD-10-CM | POA: Diagnosis not present

## 2015-12-29 NOTE — Telephone Encounter (Signed)
Patient called our office this afternoon to notify Dr, Bary Castilla that she had her mammogram done today at [facility].

## 2016-01-01 ENCOUNTER — Encounter: Payer: Self-pay | Admitting: General Surgery

## 2016-01-08 DIAGNOSIS — D225 Melanocytic nevi of trunk: Secondary | ICD-10-CM | POA: Diagnosis not present

## 2016-01-08 DIAGNOSIS — L299 Pruritus, unspecified: Secondary | ICD-10-CM | POA: Diagnosis not present

## 2016-01-08 DIAGNOSIS — D2261 Melanocytic nevi of right upper limb, including shoulder: Secondary | ICD-10-CM | POA: Diagnosis not present

## 2016-01-08 DIAGNOSIS — D2272 Melanocytic nevi of left lower limb, including hip: Secondary | ICD-10-CM | POA: Diagnosis not present

## 2016-03-25 SURGERY — TYMPANOPLASTY
Anesthesia: General | Laterality: Right

## 2016-09-26 ENCOUNTER — Ambulatory Visit (INDEPENDENT_AMBULATORY_CARE_PROVIDER_SITE_OTHER): Payer: 59 | Admitting: Internal Medicine

## 2016-09-26 ENCOUNTER — Encounter: Payer: Self-pay | Admitting: Internal Medicine

## 2016-09-26 VITALS — BP 92/64 | HR 73 | Temp 98.0°F | Resp 15 | Ht 65.0 in | Wt 135.6 lb

## 2016-09-26 DIAGNOSIS — Z1231 Encounter for screening mammogram for malignant neoplasm of breast: Secondary | ICD-10-CM

## 2016-09-26 DIAGNOSIS — I341 Nonrheumatic mitral (valve) prolapse: Secondary | ICD-10-CM

## 2016-09-26 DIAGNOSIS — N920 Excessive and frequent menstruation with regular cycle: Secondary | ICD-10-CM

## 2016-09-26 DIAGNOSIS — D699 Hemorrhagic condition, unspecified: Secondary | ICD-10-CM | POA: Diagnosis not present

## 2016-09-26 DIAGNOSIS — N939 Abnormal uterine and vaginal bleeding, unspecified: Secondary | ICD-10-CM

## 2016-09-26 DIAGNOSIS — E7800 Pure hypercholesterolemia, unspecified: Secondary | ICD-10-CM

## 2016-09-26 DIAGNOSIS — Q676 Pectus excavatum: Secondary | ICD-10-CM

## 2016-09-26 DIAGNOSIS — Z8679 Personal history of other diseases of the circulatory system: Secondary | ICD-10-CM | POA: Diagnosis not present

## 2016-09-26 DIAGNOSIS — I471 Supraventricular tachycardia, unspecified: Secondary | ICD-10-CM

## 2016-09-26 DIAGNOSIS — Z1239 Encounter for other screening for malignant neoplasm of breast: Secondary | ICD-10-CM

## 2016-09-26 DIAGNOSIS — Z0000 Encounter for general adult medical examination without abnormal findings: Secondary | ICD-10-CM | POA: Diagnosis not present

## 2016-09-26 LAB — CBC WITH DIFFERENTIAL/PLATELET
BASOS ABS: 0 10*3/uL (ref 0.0–0.1)
Basophils Relative: 0.5 % (ref 0.0–3.0)
EOS ABS: 0.1 10*3/uL (ref 0.0–0.7)
Eosinophils Relative: 2.2 % (ref 0.0–5.0)
HCT: 42.7 % (ref 36.0–46.0)
Hemoglobin: 15 g/dL (ref 12.0–15.0)
Lymphocytes Relative: 24.8 % (ref 12.0–46.0)
Lymphs Abs: 1.3 10*3/uL (ref 0.7–4.0)
MCHC: 35.1 g/dL (ref 30.0–36.0)
MCV: 89.8 fl (ref 78.0–100.0)
MONO ABS: 0.4 10*3/uL (ref 0.1–1.0)
Monocytes Relative: 8.2 % (ref 3.0–12.0)
NEUTROS ABS: 3.5 10*3/uL (ref 1.4–7.7)
NEUTROS PCT: 64.3 % (ref 43.0–77.0)
PLATELETS: 156 10*3/uL (ref 150.0–400.0)
RBC: 4.76 Mil/uL (ref 3.87–5.11)
RDW: 12.4 % (ref 11.5–15.5)
WBC: 5.4 10*3/uL (ref 4.0–10.5)

## 2016-09-26 LAB — LIPID PANEL
CHOL/HDL RATIO: 3
CHOLESTEROL: 145 mg/dL (ref 0–200)
HDL: 46.9 mg/dL (ref >39.00)
LDL CALC: 87 mg/dL (ref 0–99)
NonHDL: 97.74
Triglycerides: 56 mg/dL (ref 0.0–149.0)
VLDL: 11.2 mg/dL (ref 0.0–40.0)

## 2016-09-26 LAB — COMPREHENSIVE METABOLIC PANEL
ALBUMIN: 4.5 g/dL (ref 3.5–5.2)
ALT: 8 U/L (ref 0–35)
AST: 15 U/L (ref 0–37)
Alkaline Phosphatase: 47 U/L (ref 39–117)
BUN: 15 mg/dL (ref 6–23)
CHLORIDE: 105 meq/L (ref 96–112)
CO2: 28 meq/L (ref 19–32)
Calcium: 9.1 mg/dL (ref 8.4–10.5)
Creatinine, Ser: 0.72 mg/dL (ref 0.40–1.20)
GFR: 94.04 mL/min (ref >60.00)
Glucose, Bld: 94 mg/dL (ref 70–99)
POTASSIUM: 3.9 meq/L (ref 3.5–5.1)
SODIUM: 140 meq/L (ref 135–145)
Total Bilirubin: 1.7 mg/dL — ABNORMAL HIGH (ref 0.2–1.2)
Total Protein: 7.2 g/dL (ref 6.0–8.3)

## 2016-09-26 LAB — TSH: TSH: 1.47 u[IU]/mL (ref 0.35–4.50)

## 2016-09-26 NOTE — Progress Notes (Signed)
Patient ID: Sarah Pratt, female    DOB: [birth year]  Age: 41 y.o. MRN: [id number]  The patient is here for annual PREVENTIVE  Examination and management of other chronic and acute problems.  LAST SEEN ONE YEAR AGO HEALTH MAINTENANCE REVIEWED: PAP NORMAL 2017 MAMMOGRAM  2017  AUG 22   TDAP  2015 MIRENA IUD    The risk factors are reflected in the social history.  The roster of all physicians providing medical care to patient - is listed in the Snapshot section of the chart.  Activities of daily living:  The patient is 100% independent in all ADLs: dressing, toileting, feeding as well as independent mobility  Home safety : The patient has smoke detectors in the home. They wear seatbelts.  There are no firearms at home. There is no violence in the home.   There is no risks for hepatitis, STDs or HIV. There is no   history of blood transfusion. They have no travel history to infectious disease endemic areas of the world.  The patient has seen their dentist in the last six month. They have seen their eye doctor in the last year. They admit to slight hearing difficulty with regard to whispered voices and some television programs.  They have deferred audiologic testing in the last year.  They do not  have excessive sun exposure. Discussed the need for sun protection: hats, long sleeves and use of sunscreen if there is significant sun exposure.   Diet: the importance of a healthy diet is discussed. They do have a healthy diet.  The benefits of regular aerobic exercise were discussed. She walks 4 times per week ,  20 minutes.   Depression screen: there are no signs or vegative symptoms of depression- irritability, change in appetite, anhedonia, sadness/tearfullness.  Cognitive assessment: the patient manages all their financial and personal affairs and is actively engaged. They could relate day,date,year and events; recalled 2/3 objects at 3 minutes; performed clock-face test normally.  The  following portions of the patient's history were reviewed and updated as appropriate: allergies, current medications, past family history, past medical history,  past surgical history, past social history  and problem list.  Visual acuity was not assessed per patient preference since she has regular follow up with her ophthalmologist. Hearing and body mass index were assessed and reviewed.   During the course of the visit the patient was educated and counseled about appropriate screening and preventive services including : fall prevention , diabetes screening, nutrition counseling, colorectal cancer screening, and recommended immunizations.    CC: The primary encounter diagnosis was SVT (supraventricular tachycardia) ([place]). Diagnoses of Breast cancer screening, Excessive vaginal bleeding, Pure hypercholesterolemia, Mitral valve prolapse, History of PSVT (paroxysmal supraventricular tachycardia), Congenital pectus excavatum, Encounter for preventive health examination, and Bleeding diathesis ([place]) were also pertinent to this visit. OCCASIONAL RUNS OF SVT  SELF MANAGED WITH COUGH,  HAS PRN BETA BLOCKER . [facility] 2012 .  MITRAL VALVE PROLAPSE  REFERRAL TO LOCAL CARDIOLOGY DISCUSSED.   9 YR OLD SON HAS HAD SEVERAL SURGERIES ON HIS EARCOMPLICATED BY BRUISING,   AND A CLOTTING DISORDER HAS BEEN SUSPECTED SO FURTHER WORKUP MAY BE NEEDED  ON HER   PATIENT HAS HEAVY PERIODS. SINCE CHILDHOOD,  PERIODS IRREGULAR , AND HEMORRHAGED AFTER BOTH DELIVERIES , NO TRANSFUSIONS,  NO PRIOR WORKUP     History Jaylissa has a past medical history of Allergy; Arthritis; Asthma; Cardiac arrhythmia due to congenital heart disease; Chicken pox;  Congenital pectus excavatum; Depression; Heart murmur; and SVT (supraventricular tachycardia) ([place]).   She has a past surgical history that includes Fracture surgery (Right, 08/1998) and Shoulder surgery (Right, 08/1998, 02/1999, 09/2013).   Her family  history includes Arthritis in her father; Atrial fibrillation in her mother; Breast cancer in her maternal aunt; Colon cancer in her maternal grandmother; Diabetes (age of onset: 55) in her mother; Hyperlipidemia in her father and mother; Multiple sclerosis (age of onset: 52) in her sister; Parkinson's disease (age of onset: 18) in her father; Rheum arthritis (age of onset: 9) in her sister.She reports that she has never smoked. She has never used smokeless tobacco. She reports that she does not drink alcohol or use drugs.  Outpatient Medications Prior to Visit  Medication Sig Dispense Refill  . celecoxib (CELEBREX) 200 MG capsule Take 200 mg by mouth daily.    . Cholecalciferol (D 1000) 1000 units capsule Take 1,000 Units by mouth daily.    Marland Kitchen FLUoxetine (PROZAC) 40 MG capsule Take 1 capsule by mouth daily.  3  . fluticasone (FLONASE) 50 MCG/ACT nasal spray Place 2 sprays into both nostrils daily.    Marland Kitchen levonorgestrel (MIRENA) 20 MCG/24HR IUD by Intrauterine route.     . Turmeric (RA TURMERIC) 500 MG CAPS Take 1,000 mg by mouth daily.     No facility-administered medications prior to visit.     Review of Systems   Patient denies headache, fevers, malaise, unintentional weight loss, skin rash, eye pain, sinus congestion and sinus pain, sore throat, dysphagia,  hemoptysis , cough, dyspnea, wheezing, chest pain, palpitations, orthopnea, edema, abdominal pain, nausea, melena, diarrhea, constipation, flank pain, dysuria, hematuria, urinary  Frequency, nocturia, numbness, tingling, seizures,  Focal weakness, Loss of consciousness,  Tremor, insomnia, depression, anxiety, and suicidal ideation.      Objective:  BP 92/64 (BP Location: Left Arm, Patient Position: Sitting, Cuff Size: Normal)   Pulse 73   Temp 98 F (36.7 C) (Oral)   Resp 15   Ht 5\' 5"  (1.651 m)   Wt 135 lb 9.6 oz (61.5 kg)   SpO2 98%   BMI 22.57 kg/m   Physical Exam  General appearance: alert, cooperative and appears stated  age Head: Normocephalic, without obvious abnormality, atraumatic Eyes: conjunctivae/corneas clear. PERRL, EOM's intact. Fundi benign. Ears: normal TM's and external ear canals both ears Nose: Nares normal. Septum midline. Mucosa normal. No drainage or sinus tenderness. Throat: lips, mucosa, and tongue normal; teeth and gums normal Neck: no adenopathy, no carotid bruit, no JVD, supple, symmetrical, trachea midline and thyroid not enlarged, symmetric, no tenderness/mass/nodules Lungs: clear to auscultation bilaterally Breasts: normal appearance, no masses or tenderness Heart: regular rate and rhythm, S1, S2 normal, no murmur, click, rub or gallop Abdomen: soft, non-tender; bowel sounds normal; no masses,  no organomegaly Extremities: extremities normal, atraumatic, no cyanosis or edema Pulses: 2+ and symmetric Skin: Skin color, texture, turgor normal. No rashes or lesions Neurologic: Alert and oriented X 3, normal strength and tone. Normal symmetric reflexes. Normal coordination and gait.    Assessment & Plan:   Problem List Items Addressed This Visit    Hyperlipidemia   Relevant Orders   Lipid panel (Completed)   History of PSVT (paroxysmal supraventricular tachycardia)    She has infrequent episodes that are resolved with vagal maneuvers. , and history of mitral valve prolapse.  Referral to [facility] for annual followp      Encounter for preventive health examination    Annual comprehensive preventive exam  was done as well as an evaluation and management of chronic conditions .  During the course of the visit the patient was educated and counseled about appropriate screening and preventive services including :  diabetes screening, lipid analysis with projected  10 year  risk for CAD , nutrition counseling, breast, cervical and colorectal cancer screening, and recommended immunizations.  Printed recommendations for health maintenance screenings was givem      Congenital pectus  excavatum   Bleeding diathesis (HCC)    History of menorrhagia, since adolescence, no prior workup, son has some as of yet undiagnosed diathesis  Will screen for von willebrand's disease.       Other Visit Diagnoses    SVT (supraventricular tachycardia) ([place])    -  Primary   Relevant Orders   Ambulatory referral to Cardiology   Breast cancer screening       Relevant Orders   MM SCREENING BREAST TOMO BILATERAL   Excessive vaginal bleeding       Relevant Orders   von Willebrand Factor Screen   CBC with Differential/Platelet (Completed)   Comprehensive metabolic panel (Completed)   TSH (Completed)   Mitral valve prolapse       Relevant Orders   Ambulatory referral to Cardiology      I am having Ms. Frankenfield maintain her levonorgestrel, fluticasone, FLUoxetine, Cholecalciferol, Turmeric, and celecoxib.  No orders of the defined types were placed in this encounter.   There are no discontinued medications.  Follow-up: No Follow-up on file.   [name], MD

## 2016-09-26 NOTE — Patient Instructions (Signed)
I WILL make a referral to [facility] for follow up on SVT/MVP  I am screening you for Von Willebrand's Disease today along with your usual labs  Your next mammogram is due on or after August 22

## 2016-09-28 DIAGNOSIS — D699 Hemorrhagic condition, unspecified: Secondary | ICD-10-CM | POA: Insufficient documentation

## 2016-09-28 NOTE — Assessment & Plan Note (Signed)
History of menorrhagia, since adolescence, no prior workup, son has some as of yet undiagnosed diathesis  Will screen for von willebrand's disease.

## 2016-09-28 NOTE — Assessment & Plan Note (Addendum)
She has infrequent episodes that are resolved with vagal maneuvers. , and history of mitral valve prolapse.  Referral to [facility] for annual followp

## 2016-09-28 NOTE — Assessment & Plan Note (Signed)
Annual comprehensive preventive exam was done as well as an evaluation and management of chronic conditions .  During the course of the visit the patient was educated and counseled about appropriate screening and preventive services including :  diabetes screening, lipid analysis with projected  10 year  risk for CAD , nutrition counseling, breast, cervical and colorectal cancer screening, and recommended immunizations.  Printed recommendations for health maintenance screenings was givem

## 2016-09-29 ENCOUNTER — Encounter: Payer: Self-pay | Admitting: Internal Medicine

## 2016-09-29 LAB — VON WILLEBRAND FACTOR SCREEN
APTT: 26.3 s
Factor VIII Activity: 116 %
von Willebrand Factor Activity: 141 %
von Willebrand Factor Antigen: 148 %

## 2016-10-31 ENCOUNTER — Ambulatory Visit (INDEPENDENT_AMBULATORY_CARE_PROVIDER_SITE_OTHER): Payer: 59 | Admitting: Cardiovascular Disease

## 2016-10-31 ENCOUNTER — Encounter: Payer: Self-pay | Admitting: Cardiovascular Disease

## 2016-10-31 VITALS — BP 98/64 | HR 53 | Ht 66.0 in | Wt 136.0 lb

## 2016-10-31 DIAGNOSIS — I341 Nonrheumatic mitral (valve) prolapse: Secondary | ICD-10-CM

## 2016-10-31 DIAGNOSIS — I471 Supraventricular tachycardia: Secondary | ICD-10-CM | POA: Diagnosis not present

## 2016-10-31 NOTE — Progress Notes (Signed)
Cardiology Office Note   Date:  10/31/2016   ID:  ARLISHA PATALANO, DOB Nov 03, 1975, MRN [id number]  PCP:  [name], MD  Cardiologist:   Kathlyn Sacramento, MD   Chief Complaint  Patient presents with  . other    Ref by Dr. Derrel Nip for cardiac eval with a Hx. of MVP & SVT; pt. saw Dr. Lutricia Feil at [facility]. Meds reviewed by the pt. verbally.        History of Present Illness: Sarah Pratt is a 41 y.o. female who Was referred by Dr.Tullo for evaluation of paroxysmal supraventricular tachycardia and mitral valve prolapse. She has known history of asthma, congenital pectus excavatum and depression. She was diagnosed with paroxysmal supraventricular tachycardia about 5-6 years ago. She had one emergency room visit initially during that time due to persistent tachycardia more than 15-20 minutes duration. She did not require adenosine. Since that time, she has not had any prolonged episodes. She was seen by Dr. Lutricia Feil at [facility]. Initially, ablation was considered but her episodes became less frequent and shorter in duration. She had an echocardiogram done which showed mild mitral valve prolapse with mild regurgitation. Ejection fraction was normal. No echocardiogram evaluation since 2015. Overall, she has been doing reasonably well with minimal exertional shortness of breath and palpitations. She reports episodes of tachycardia and average of once a week but these are usually short-lived and easily terminated with vagal maneuvers. Her episodes seem to be triggered by excessive caffeine or bending.  She is a lifelong nonsmoker. She used to work at [employer] but currently works at [employer].   Past Medical History:  Diagnosis Date  . Allergy   . Arthritis   . Asthma   . Cardiac arrhythmia due to congenital heart disease   . Chicken pox   . Congenital pectus excavatum   . Depression   . Heart murmur    mitral valve prolapse  . SVT (supraventricular tachycardia)  (HCC)     Past Surgical History:  Procedure Laterality Date  . FRACTURE SURGERY Right 08/1998   shoulder fracture  . SHOULDER SURGERY Right 08/1998, 02/1999, 09/2013     Current Outpatient Prescriptions  Medication Sig Dispense Refill  . celecoxib (CELEBREX) 200 MG capsule Take 200 mg by mouth daily.    . Cholecalciferol (D 1000) 1000 units capsule Take 1,000 Units by mouth daily.    Marland Kitchen FLUoxetine (PROZAC) 40 MG capsule Take 1 capsule by mouth daily.  3  . fluticasone (FLONASE) 50 MCG/ACT nasal spray Place 2 sprays into both nostrils daily.    Marland Kitchen levonorgestrel (MIRENA) 20 MCG/24HR IUD by Intrauterine route.     . Turmeric (RA TURMERIC) 500 MG CAPS Take 1,000 mg by mouth daily.     No current facility-administered medications for this visit.     Allergies:   Patient has no known allergies.    Social History:  The patient  reports that she has never smoked. She has never used smokeless tobacco. She reports that she does not drink alcohol or use drugs.   Family History:  The patient's family history includes Arthritis in her father; Atrial fibrillation in her mother; Breast cancer in her maternal aunt; Colon cancer in her maternal grandmother; Diabetes (age of onset: 66) in her mother; Hyperlipidemia in her father and mother; Multiple sclerosis (age of onset: 37) in her sister; Parkinson's disease (age of onset: 57) in her father; Rheum arthritis (age of onset: 39) in her sister.  ROS:  Please see the history of present illness.   Otherwise, review of systems are positive for none.   All other systems are reviewed and negative.    PHYSICAL EXAM: VS:  BP 98/64 (BP Location: Right Arm, Patient Position: Sitting, Cuff Size: Normal)   Pulse (!) 53   Ht 5\' 6"  (1.676 m)   Wt 136 lb (61.7 kg)   BMI 21.95 kg/m  , BMI Body mass index is 21.95 kg/m. GEN: Well nourished, well developed, in no acute distress  HEENT: normal  Neck: no JVD, carotid bruits, or masses Cardiac: RRR; no murmurs,  rubs, or gallops,no edema  Respiratory:  clear to auscultation bilaterally, normal work of breathing GI: soft, nontender, nondistended, + BS MS: no deformity or atrophy  Skin: warm and dry, no rash Neuro:  Strength and sensation are intact Psych: euthymic mood, full affect   EKG:  EKG is ordered today. The ekg ordered today demonstrates normal sinus rhythm with no significant ST or T wave changes. Possible left atrial enlargement.   Recent Labs: 09/26/2016: ALT 8; BUN 15; Creatinine, Ser 0.72; Hemoglobin 15.0; Platelets 156.0; Potassium 3.9; Sodium 140; TSH 1.47    Lipid Panel    Component Value Date/Time   CHOL 145 09/26/2016 0919   TRIG 56.0 09/26/2016 0919   HDL 46.90 09/26/2016 0919   CHOLHDL 3 09/26/2016 0919   VLDL 11.2 09/26/2016 0919   LDLCALC 87 09/26/2016 0919      Wt Readings from Last 3 Encounters:  10/31/16 136 lb (61.7 kg)  09/26/16 135 lb 9.6 oz (61.5 kg)  11/17/15 144 lb (65.3 kg)        PAD Screen 10/31/2016  Previous PAD dx? No  Previous surgical procedure? No  Pain with walking? No  Feet/toe relief with dangling? No  Painful, non-healing ulcers? No  Extremities discolored? No      ASSESSMENT AND PLAN:  1.  Paroxysmal supraventricular tachycardia: Episodes are overall short in duration and respond to vagal maneuvers. Thus, no need for treatment at this time. If episodes become more prolonged, the best option is probably ablation.  2. Mitral valve prolapse: No cardiac murmurs by exam. I'm going to obtain an echocardiogram as a follow-up and to establish a baseline in our clinic.    Disposition:   FU with me in 1 year  Signed,  Kathlyn Sacramento, MD  10/31/2016 1:35 PM    [place]

## 2016-10-31 NOTE — Patient Instructions (Addendum)
Medication Instructions:  Your physician recommends that you continue on your current medications as directed. Please refer to the Current Medication list given to you today.   Labwork: none  Testing/Procedures: Your physician has requested that you have an echocardiogram. Echocardiography is a painless test that uses sound waves to create images of your heart. It provides your doctor with information about the size and shape of your heart and how well your heart's chambers and valves are working. This procedure takes approximately one hour. There are no restrictions for this procedure.    Follow-Up: Your physician wants you to follow-up in: one year with Dr. Arida.  You will receive a reminder letter in the mail two months in advance. If you don't receive a letter, please call our office to schedule the follow-up appointment.   Any Other Special Instructions Will Be Listed Below (If Applicable).     If you need a refill on your cardiac medications before your next appointment, please call your pharmacy.  Echocardiogram An echocardiogram, or echocardiography, uses sound waves (ultrasound) to produce an image of your heart. The echocardiogram is simple, painless, obtained within a short period of time, and offers valuable information to your health care provider. The images from an echocardiogram can provide information such as:  Evidence of coronary artery disease (CAD).  Heart size.  Heart muscle function.  Heart valve function.  Aneurysm detection.  Evidence of a past heart attack.  Fluid buildup around the heart.  Heart muscle thickening.  Assess heart valve function. Tell a health care provider about:  Any allergies you have.  All medicines you are taking, including vitamins, herbs, eye drops, creams, and over-the-counter medicines.  Any problems you or family members have had with anesthetic medicines.  Any blood disorders you have.  Any surgeries you have  had.  Any medical conditions you have.  Whether you are pregnant or may be pregnant. What happens before the procedure? No special preparation is needed. Eat and drink normally. What happens during the procedure?  In order to produce an image of your heart, gel will be applied to your chest and a wand-like tool (transducer) will be moved over your chest. The gel will help transmit the sound waves from the transducer. The sound waves will harmlessly bounce off your heart to allow the heart images to be captured in real-time motion. These images will then be recorded.  You may need an IV to receive a medicine that improves the quality of the pictures. What happens after the procedure? You may return to your normal schedule including diet, activities, and medicines, unless your health care provider tells you otherwise. This information is not intended to replace advice given to you by your health care provider. Make sure you discuss any questions you have with your health care provider. Document Released: 04/22/2000 Document Revised: 12/12/2015 Document Reviewed: 12/31/2012 Elsevier Interactive Patient Education  2017 [employer] Inc[employer]  

## 2016-11-01 ENCOUNTER — Other Ambulatory Visit: Payer: Self-pay

## 2016-11-01 ENCOUNTER — Ambulatory Visit (INDEPENDENT_AMBULATORY_CARE_PROVIDER_SITE_OTHER): Payer: 59

## 2016-11-01 DIAGNOSIS — I341 Nonrheumatic mitral (valve) prolapse: Secondary | ICD-10-CM | POA: Diagnosis not present

## 2017-01-03 ENCOUNTER — Ambulatory Visit
Admission: RE | Admit: 2017-01-03 | Discharge: 2017-01-03 | Disposition: A | Discharge status: Home / Self Care | Payer: 59 | Source: Ambulatory Visit | Attending: Internal Medicine | Admitting: Internal Medicine

## 2017-01-03 DIAGNOSIS — Z1231 Encounter for screening mammogram for malignant neoplasm of breast: Secondary | ICD-10-CM | POA: Insufficient documentation

## 2017-01-03 DIAGNOSIS — Z1239 Encounter for other screening for malignant neoplasm of breast: Secondary | ICD-10-CM

## 2017-01-06 DIAGNOSIS — D2272 Melanocytic nevi of left lower limb, including hip: Secondary | ICD-10-CM | POA: Diagnosis not present

## 2017-01-06 DIAGNOSIS — D225 Melanocytic nevi of trunk: Secondary | ICD-10-CM | POA: Diagnosis not present

## 2017-01-06 DIAGNOSIS — D2271 Melanocytic nevi of right lower limb, including hip: Secondary | ICD-10-CM | POA: Diagnosis not present

## 2017-01-06 DIAGNOSIS — D2261 Melanocytic nevi of right upper limb, including shoulder: Secondary | ICD-10-CM | POA: Diagnosis not present

## 2017-02-21 ENCOUNTER — Emergency Department
Admission: EM | Admit: 2017-02-21 | Discharge: 2017-02-21 | Disposition: A | Discharge status: Home / Self Care | Payer: 59 | Attending: Emergency Medicine | Admitting: Emergency Medicine

## 2017-02-21 ENCOUNTER — Emergency Department: Payer: 59

## 2017-02-21 ENCOUNTER — Telehealth: Payer: Self-pay | Admitting: Cardiovascular Disease

## 2017-02-21 DIAGNOSIS — R0789 Other chest pain: Secondary | ICD-10-CM | POA: Diagnosis not present

## 2017-02-21 DIAGNOSIS — Z79899 Other long term (current) drug therapy: Secondary | ICD-10-CM | POA: Insufficient documentation

## 2017-02-21 DIAGNOSIS — J45909 Unspecified asthma, uncomplicated: Secondary | ICD-10-CM | POA: Insufficient documentation

## 2017-02-21 DIAGNOSIS — R079 Chest pain, unspecified: Secondary | ICD-10-CM | POA: Insufficient documentation

## 2017-02-21 DIAGNOSIS — R0602 Shortness of breath: Secondary | ICD-10-CM | POA: Diagnosis not present

## 2017-02-21 LAB — CBC
HCT: 44.7 % (ref 35.0–47.0)
HEMOGLOBIN: 15.3 g/dL (ref 12.0–16.0)
MCH: 31.2 pg (ref 26.0–34.0)
MCHC: 34.3 g/dL (ref 32.0–36.0)
MCV: 91.1 fL (ref 80.0–100.0)
PLATELETS: 198 10*3/uL (ref 150–440)
RBC: 4.91 MIL/uL (ref 3.80–5.20)
RDW: 12.4 % (ref 11.5–14.5)
WBC: 6.2 10*3/uL (ref 3.6–11.0)

## 2017-02-21 LAB — BASIC METABOLIC PANEL
ANION GAP: 10 (ref 5–15)
BUN: 14 mg/dL (ref 6–20)
CHLORIDE: 103 mmol/L (ref 101–111)
CO2: 27 mmol/L (ref 22–32)
Calcium: 9 mg/dL (ref 8.9–10.3)
Creatinine, Ser: 0.69 mg/dL (ref 0.44–1.00)
GFR calc Af Amer: >60 mL/min (ref >60)
Glucose, Bld: 98 mg/dL (ref 65–99)
Potassium: 4.2 mmol/L (ref 3.5–5.1)
SODIUM: 140 mmol/L (ref 135–145)

## 2017-02-21 LAB — HCG, QUANTITATIVE, PREGNANCY

## 2017-02-21 LAB — TROPONIN I: Troponin I: <0.03 ng/mL (ref <0.03)

## 2017-02-21 LAB — TSH: TSH: 2.373 u[IU]/mL (ref 0.350–4.500)

## 2017-02-21 LAB — FIBRIN DERIVATIVES D-DIMER (ARMC ONLY): Fibrin derivatives D-dimer (ARMC): 213.15 ng/mL (FEU) (ref 0.00–499.00)

## 2017-02-21 MED ORDER — ASPIRIN 81 MG PO CHEW
324.0000 mg | CHEWABLE_TABLET | Freq: Once | ORAL | Status: AC
  Administered 2017-02-21: 324 mg via ORAL
  Filled 2017-02-21: qty 4

## 2017-02-21 NOTE — Discharge Instructions (Addendum)

## 2017-02-21 NOTE — Telephone Encounter (Signed)
No answer. Left message to call back.   

## 2017-02-21 NOTE — ED Provider Notes (Signed)
-----------------------------------------   6:01 PM on 02/21/2017 -----------------------------------------  Patient's repeat troponin is negative. Patient appears extremely well. D-dimer is negative otherwise workup is normal. We will discharge home with PCP follow-up.   Harvest Dark, MD 02/21/17 Johnnye Lana

## 2017-02-21 NOTE — Telephone Encounter (Signed)
S/w Dr. Saunders Revel who advised patient should go to the ED for evaluation of symptoms at this time. Patient notified and she verbalized understanding.

## 2017-02-21 NOTE — ED Provider Notes (Signed)
[facility] Emergency Department Provider Note  ____________________________________________  Time seen: Approximately 3:35 PM  I have reviewed the triage vital signs and the nursing notes.   HISTORY  Chief Complaint Chest Pain   HPI Sarah Pratt is a 41 y.o. female with a history of SVT, mitral valve prolapse who presents for evaluation of chest pain and shortness of breath. Patient reports for the last week she has had constant mild shortness of breath which gets worse with minimal exertion. She has been having intermittent episodes that she describes as mild to moderate chest tightness associated with dizziness. Today she was walking back to the hospital after scheduling an appointment with her cardiologist when she had a more severe episode of chest tightness radiating down her left arm which prompted her visit to the emergency room. She denies chest pain at this time. The patient reports that these symptoms are different to her prior symptoms of SVT. She denies palpitations, fever, chills, cough, congestion. She does have family history of ischemic heart disease in her mother and has a family history of blood clot in her mother. patient has a Mirena IUD. She denies any recent travel or immobilization, leg pain or swelling, hemoptysis.  Past Medical History:  Diagnosis Date  . Allergy   . Arthritis   . Asthma   . Cardiac arrhythmia due to congenital heart disease   . Chicken pox   . Congenital pectus excavatum   . Depression   . Heart murmur    mitral valve prolapse  . SVT (supraventricular tachycardia) [facility])     Patient Active Problem List   Diagnosis Date Noted  . 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 Visit for screening mammogram 11/17/2015  . Encounter for preventive health examination 10/03/2015  . Family history of breast cancer in female 10/03/2015  . History of dysplastic nevus 10/03/2015  . Vitamin D deficiency 08/21/2015  . Other  fatigue 08/21/2015  . Hyperlipidemia 08/21/2015  . History of depression 08/21/2015  . Chronic right shoulder pain 08/21/2015  . Congenital pectus excavatum 08/21/2015  . IUD (intrauterine device) in place 08/18/2015  . History of PSVT (paroxysmal supraventricular tachycardia) 08/18/2015  . MVP (mitral valve prolapse) 08/18/2015    Past Surgical History:  Procedure Laterality Date  . FRACTURE SURGERY Right 08/1998   shoulder fracture  . SHOULDER SURGERY Right 08/1998, 02/1999, 09/2013    Prior to Admission medications   Medication Sig Start Date End Date Taking? Authorizing Provider  celecoxib (CELEBREX) 200 MG capsule Take 200 mg by mouth daily.    [provider]  Cholecalciferol (D 1000) 1000 units capsule Take 1,000 Units by mouth daily.    [provider]  FLUoxetine (PROZAC) 40 MG capsule Take 1 capsule by mouth daily. 08/11/15   [provider]  fluticasone (FLONASE) 50 MCG/ACT nasal spray Place 2 sprays into both nostrils daily. 07/16/15   [provider]  levonorgestrel (MIRENA) 20 MCG/24HR IUD by Intrauterine route.  04/16/13   [provider]  Turmeric (RA TURMERIC) 500 MG CAPS Take 1,000 mg by mouth daily.    [provider]    Allergies Patient has no known allergies.  Family History  Problem Relation Age of Onset  . Hyperlipidemia Mother   . Diabetes Mother 10       late type 2  . Atrial fibrillation Mother   . Arthritis Father        rheumatoid   . Hyperlipidemia Father   .  Parkinson's disease Father 88       deceased  . Multiple sclerosis Sister 63  . Rheum arthritis Sister 35  . Breast cancer Maternal Aunt        ? 50's  . Colon cancer Maternal Grandmother        ? 58's and bladder cancer    Social History Social History  Substance Use Topics  . Smoking status: Never Smoker  . Smokeless tobacco: Never Used  . Alcohol use No    Review of Systems  Constitutional: Negative for fever. Eyes: Negative  for visual changes. ENT: Negative for sore throat. Neck: No neck pain  Cardiovascular: + chest pain. Respiratory: + shortness of breath. Gastrointestinal: Negative for abdominal pain, vomiting or diarrhea. Genitourinary: Negative for dysuria. Musculoskeletal: Negative for back pain. Skin: Negative for rash. Neurological: Negative for headaches, weakness or numbness. Psych: No SI or HI  ____________________________________________   PHYSICAL EXAM:  VITAL SIGNS: ED Triage Vitals  Enc Vitals Group     BP 02/21/17 1404 116/66     Pulse Rate 02/21/17 1404 72     Resp 02/21/17 1404 20     Temp 02/21/17 1404 98.1 F (36.7 C)     Temp Source 02/21/17 1404 Oral     SpO2 02/21/17 1404 100 %     Weight 02/21/17 1405 130 lb (59 kg)     Height 02/21/17 1405 5\' 6"  (1.676 m)     Head Circumference --      Peak Flow --      Pain Score 02/21/17 1407 3     Pain Loc --      Pain Edu? --      Excl. in [place]? --     Constitutional: Alert and oriented. Well appearing and in no apparent distress. HEENT:      Head: Normocephalic and atraumatic.         Eyes: Conjunctivae are normal. Sclera is non-icteric.       Mouth/Throat: Mucous membranes are moist.       Neck: Supple with no signs of meningismus. Cardiovascular: Regular rate and rhythm. No murmurs, gallops, or rubs. 2+ symmetrical distal pulses are present in all extremities. No JVD. Respiratory: Normal respiratory effort. Lungs are clear to auscultation bilaterally. No wheezes, crackles, or rhonchi.  Gastrointestinal: Soft, non tender, and non distended with positive bowel sounds. No rebound or guarding. Genitourinary: No CVA tenderness. Musculoskeletal: Nontender with normal range of motion in all extremities. No edema, cyanosis, or erythema of extremities. Neurologic: Normal speech and language. Face is symmetric. Moving all extremities. No gross focal neurologic deficits are appreciated. Skin: Skin is warm, dry and intact. No rash  noted. Psychiatric: Mood and affect are normal. Speech and behavior are normal.  ____________________________________________   LABS (all labs ordered are listed, but only abnormal results are displayed)  Labs Reviewed  BASIC METABOLIC PANEL  CBC  TROPONIN I  FIBRIN DERIVATIVES D-DIMER ([facility] ONLY)  HCG, QUANTITATIVE, PREGNANCY  TSH  TROPONIN I   ____________________________________________  EKG  ED ECG REPORT I, Rudene Re, the attending physician, personally viewed and interpreted this ECG.  Normal sinus rhythm, rate of 75, normal intervals, normal axis, no ST elevations or depressions. ____________________________________________  RADIOLOGY  CXR:  No acute abnormality noted. Pectus excavatum accentuating the mediastinal markings. ____________________________________________   PROCEDURES  Procedure(s) performed: None Procedures Critical Care performed:  None ____________________________________________   INITIAL IMPRESSION / ASSESSMENT AND PLAN / ED COURSE  41 y.o. female with a history  of SVT, mitral valve prolapse who presents for evaluation of chest pain and shortness of breath x 1 week. No pain at this time. Due to f/h blood clots and Mirena IUD, d-dimer was sent to rule out PE which is negative. CXR clear with no evidence of pneumothorax or pneumonia. This could be patient's SVT therefore we'll monitor her on telemetry while in the ED. Will check thyroid and labs to rule out anemia or electrolyte abnormalities. We'll check pregnancy test. Initial troponin has been negative. EKG with no ischemic changes. The patient's evaluation is negative with 2 troponins negative and she remains pain-free plan to discharge home and close follow-up with her cardiologist. Care transferred to Dr. Kerman Passey      As part of my medical decision making, I reviewed the following data within the [place] notes reviewed and incorporated, Labs reviewed  , EKG interpreted , Old EKG reviewed, Old chart reviewed, Patient signed out to Dr. Kerman Passey, Radiograph reviewed , Notes from prior ED visits and [place] Controlled Substance Database    Pertinent labs & imaging results that were available during my care of the patient were reviewed by me and considered in my medical decision making (see chart for details).    ____________________________________________   FINAL CLINICAL IMPRESSION(S) / ED DIAGNOSES  Final diagnoses:  Chest pain, unspecified type      NEW MEDICATIONS STARTED DURING THIS VISIT:  New Prescriptions   No medications on file     Note:  This document was prepared using Dragon voice recognition software and may include unintentional dictation errors.    Rudene Re, MD 02/21/17 [phone]

## 2017-02-21 NOTE — Telephone Encounter (Signed)
Received incoming call from patient.  Hx SVT, not able to tolerate beta-blocker in the past r/t low BP. Last week, patient started having periods of lightheadedness and shortness of breath.  Hx of SVT but she does not feel like it is SVT but more like palpitations. Shortness of breath occurs very frequently throughout the day. She feels it increase with activity such as walking to her car. It also occurs to be worse when she lies flat at night to sleep and has had to sleep upright instead. Chest tightness occurs with activity and eases off with rest.  Today while walking she felt the shortness of breath, chest tightness and left arm pain. Co-worker said she looks pale. Patient states she's been yawning a lot.  HR has not been above 100.  She stays well hydrated and avoid caffeine.   Advised patient that if symptoms return between now and upcoming appointment she should proceed to the ED. Pt verbalized understanding to call 911 or go to the emergency room, if he develops any new or worsening symptoms. Advised patient I would discuss findings with provided and call her back.

## 2017-02-21 NOTE — Telephone Encounter (Signed)
PT came by to discuss symptoms Has not been feeling well and would like to be seen sooner Scheduled 03/22/17 with Sharolyn Douglas and added to waitlist  PT has had SOB and lightheadedness Some fatigue, not sleeping well SOB not any better laying down vs when walking Just experiencing discomfort PT is aware to go to emergency room if necessary but is trying to avoid that option Please call to advise

## 2017-02-21 NOTE — ED Triage Notes (Signed)
Pt c/o SOB over the past week, today having periods of paleness with dizziness with chest tightness and arm pain with a hx of SVT. Pt is in NAD at present.

## 2017-03-22 ENCOUNTER — Ambulatory Visit: Payer: 59 | Admitting: Nurse Practitioner

## 2017-03-22 ENCOUNTER — Encounter: Payer: Self-pay | Admitting: Nurse Practitioner

## 2017-03-22 VITALS — BP 90/60 | HR 62 | Ht 66.0 in | Wt 135.0 lb

## 2017-03-22 DIAGNOSIS — R0609 Other forms of dyspnea: Secondary | ICD-10-CM

## 2017-03-22 DIAGNOSIS — I341 Nonrheumatic mitral (valve) prolapse: Secondary | ICD-10-CM

## 2017-03-22 DIAGNOSIS — R0600 Dyspnea, unspecified: Secondary | ICD-10-CM

## 2017-03-22 DIAGNOSIS — R079 Chest pain, unspecified: Secondary | ICD-10-CM | POA: Diagnosis not present

## 2017-03-22 DIAGNOSIS — I471 Supraventricular tachycardia: Secondary | ICD-10-CM

## 2017-03-22 NOTE — Patient Instructions (Addendum)
Medication Instructions:  Your physician recommends that you continue on your current medications as directed. Please refer to the Current Medication list given to you today.   Labwork: none  Testing/Procedures: Your physician has requested that you have a stress echocardiogram. For further information please visit [email or web address]. Please follow instruction sheet as given.  Please wear comfortable walking shoes (ie., sneakers) No caffeine 24 hours before your test.   Follow-Up: Your physician recommends that you schedule a follow-up appointment in: 3 months with Dr. Fletcher Anon.    Any Other Special Instructions Will Be Listed Below (If Applicable).     If you need a refill on your cardiac medications before your next appointment, please call your pharmacy.   Exercise Stress Echocardiogram An exercise stress echocardiogram is a test to check how well your heart is working. This test uses sound waves (ultrasound) and a computer to make images of your heart before and after exercise. Ultrasound images that are taken before you exercise (your resting echocardiogram) will show how much blood is getting to your heart muscle and how well your heart muscle and heart valves are functioning. During the next part of this test, you will walk on a treadmill or ride a stationary bike to see how exercise affects your heart. While you exercise, the electrical activity of your heart will be monitored with an electrocardiogram (ECG). Your blood pressure will also be monitored. You may have this test if you:  Have chest pain or other symptoms of a heart problem.  Recently had a heart attack or heart surgery.  Have heart valve problems.  Have a condition that causes narrowing of the blood vessels that supply your heart (coronary artery disease).  Have a high risk of heart disease and are starting a new exercise program.  Have a high risk of heart disease and need to have major surgery.  Tell a  health care provider about:  Any allergies you have.  All medicines you are taking, including vitamins, herbs, eye drops, creams, and over-the-counter medicines.  Any problems you or family members have had with anesthetic medicines.  Any blood disorders you have.  Any surgeries you have had.  Any medical conditions you have.  Whether you are pregnant or may be pregnant. What are the risks? Generally, this is a safe procedure. However, problems may occur, including:  Chest pain.  Dizziness or light-headedness.  Shortness of breath.  Increased or irregular heartbeat (palpitations).  Nausea or vomiting.  Heart attack (very rare).  What happens before the procedure?  Follow instructions from your health care provider about eating or drinking restrictions. You may be asked to avoid all forms of caffeine for 24 hours before your procedure, or as told by your health care provider.  Ask your health care provider about changing or stopping your regular medicines. This is especially important if you are taking diabetes medicines or blood thinners.  If you use an inhaler, bring it with you to the test.  Wear loose, comfortable clothing and walking shoes.  Do notuse any products that contain nicotine or tobacco, such as cigarettes and e-cigarettes, for 4 hours before the test or as told by your health care provider. If you need help quitting, ask your health care provider. What happens during the procedure?  You will take off your clothes from the waist up and put on a hospital gown.  A technician will place electrodes on your chest.  A blood pressure cuff will be placed on your arm.  You will lie down on a table for an ultrasound exam before you exercise. Gel will be rubbed on your chest, and a handheld device (transducer) will be pressed against your chest and moved over your heart.  Then, you will start exercising by walking on a treadmill or pedaling a stationary  bicycle.  Your blood pressure and heart rhythm will be monitored while you exercise.  The exercise will gradually get harder or faster.  You will exercise until: ? Your heart reaches a target level. ? You are too tired to continue. ? You cannot continue because of chest pain, weakness, or dizziness.  You will have another ultrasound exam after you stop exercising. The procedure may vary among health care providers and hospitals. What happens after the procedure?  Your heart rate and blood pressure will be monitored until they return to your normal levels. Summary  An exercise stress echocardiogram is a test that uses ultrasound to check how well your heart works before and after exercise.  Before the test, follow instructions from your health care provider about stopping medications, avoiding nicotine and tobacco, and avoiding certain foods and drinks.  During the test, your blood pressure and heart rhythm will be monitored while you exercise on a treadmill or stationary bicycle. This information is not intended to replace advice given to you by your health care provider. Make sure you discuss any questions you have with your health care provider. Document Released: 04/29/2004 Document Revised: 12/16/2015 Document Reviewed: 12/16/2015 Elsevier Interactive Patient Education  2018 [employer].

## 2017-03-22 NOTE — Progress Notes (Signed)
Office Visit    Patient Name: Sarah Pratt Date of Encounter: 03/22/2017  Primary Care Provider:  [name], MD Primary Cardiologist:  Jerilynn Mages. Fletcher Anon, MD   Chief Complaint    41 y/o ? with a history of mitral valve prolapse and PSVT, who presents for evaluation related to a month long history of chest discomfort and dyspnea on exertion.  Past Medical History    Past Medical History:  Diagnosis Date  . Allergy   . Arthritis   . Asthma   . Chest pain   . Chicken pox   . Congenital pectus excavatum   . Depression   . Dyspnea on exertion   . Mitral valve prolapse    a. 10/2016 Echo: EF 55-60%, no rwma, mild MVP w/ triv MR, nl RV fxn.  Marland Kitchen PSVT (paroxysmal supraventricular tachycardia) ([place])    a. Prev eval @ [facility] - wishes to avoid RFCA @ this time.   Past Surgical History:  Procedure Laterality Date  . FRACTURE SURGERY Right 08/1998   shoulder fracture  . SHOULDER SURGERY Right 08/1998, 02/1999, 09/2013    Allergies  No Known Allergies  History of Present Illness    41 year old ? with the above past medical history including congenital pectus excavatum, mitral valve prolapse, and PSVT.  She also reports a history of dyspnea on exertion dating back to her teens.  She recently established care with Dr. Fletcher Anon in June of this year.  Echocardiogram at that time showed an EF of 55-60% with normal wall motion and mild mitral valve prolapse and only trivial mitral regurgitation.  At that time, she reported that she experiences episodes of SVT about once per week, often with bending/stooping.  This remains true today.  Symptoms generally last less than a minute as she is able to break the SVT with vagal maneuvers.  She sometimes has to put her face in cold water if symptoms last longer than a minute or so.  Overall, she feels as though SVT symptoms are stable and she remains disinterested in catheter ablation at this time.  On October 16, she presented to the emergency department  after noticing mild left-sided chest discomfort that was rating down her left arm.  This seemed to worsen with exertion.  She also noted dyspnea on exertion out of proportion to the level of activity she was performing.  In the emergency department, d-dimer was normal as were troponins and ECG.  She was subsequently discharged home and advised to follow-up with cardiology.  She has continued to have dyspnea on exertion though, it has not been as profound as it was that particular day.  She says this type of dyspnea on exertion dates back to her teenage years.  In the past, she was told it was related to asthma but she never experienced wheezing and inhalers did not improve it when she was in high school.  She has also continued to have intermittent mild chest discomfort that does not limit activities.  It is not worsened with deep breathing, palpation, position changes, or cough.  She says she is mostly just living with and is not particularly bothered by it.  She denies PND, orthopnea, dizziness, syncope, edema, or early satiety.  Home Medications    Prior to Admission medications   Medication Sig Start Date End Date Taking? Authorizing Provider  celecoxib (CELEBREX) 200 MG capsule Take 200 mg by mouth daily.   Yes [provider]  Cholecalciferol (D 1000) 1000 units  capsule Take 1,000 Units by mouth daily.   Yes [provider]  FLUoxetine (PROZAC) 40 MG capsule Take 1 capsule by mouth daily. 08/11/15  Yes [provider]  fluticasone (FLONASE) 50 MCG/ACT nasal spray Place 2 sprays into both nostrils daily. 07/16/15  Yes [provider]  levonorgestrel (MIRENA) 20 MCG/24HR IUD by Intrauterine route.  04/16/13  Yes [provider]  Turmeric (RA TURMERIC) 500 MG CAPS Take 1,000 mg by mouth daily.   Yes [provider]    Review of Systems    Approximately once weekly palpitations associated with PSVT lasting under a minute.  Some degree of chronic  dyspnea on exertion.  She has also been having vague/mild left-sided chest discomfort at rest and with exertion.  She denies PND, orthopnea, dizziness, syncope, edema, or early satiety.  All other systems reviewed and are otherwise negative except as noted above.  Physical Exam    VS:  BP 90/60 (BP Location: Left Arm, Patient Position: Sitting, Cuff Size: Normal)   Pulse 62   Ht 5\' 6"  (1.676 m)   Wt 135 lb (61.2 kg)   BMI 21.79 kg/m  , BMI Body mass index is 21.79 kg/m. GEN: Well nourished, well developed, in no acute distress.  HEENT: normal.  Neck: Supple, no JVD, carotid bruits, or masses. Cardiac: RRR, no murmurs, rubs, or gallops. No clubbing, cyanosis, edema.  Radials/DP/PT 2+ and equal bilaterally.  Respiratory:  Respirations regular and unlabored, clear to auscultation bilaterally. GI: Soft, nontender, nondistended, BS + x 4. MS: no deformity or atrophy. Skin: warm and dry, no rash. Neuro:  Strength and sensation are intact. Psych: Normal affect.  Accessory Clinical Findings    ECG -regular sinus rhythm, 62, no acute ST or T changes.  Assessment & Plan    1.  Retrosternal chest pain/dyspnea on exertion: Patient was seen in the emergency department on October 16 with mild chest discomfort and dyspnea on exertion.  She has a long history of dyspnea on exertion dating back to her teenage years but on October 16, symptoms were more profound and out of proportion to her activity.  She continued to have chest pain throughout her ER stay and despite this, she was seen to have normal troponins, normal d-dimer, and no SVT on the monitor.  Since her ER visit, she is continued to have intermittent chest discomfort which is mild in nature.  It is often associated with dyspnea and exertion.  She says she has not been thinking too much of it and just goes about her day.  Given the absence of objective evidence of ischemia during chest pain episode while in the emergency department, it makes  coronary artery disease very unlikely.  That said, I will arrange for a stress echocardiogram to gauge exercise tolerance, blood pressure response to exercise, and rule out wall motion abnormalities or worsening of mitral valve prolapse with activity.  2.  PSVT: She continues to have about once weekly episodes.  These are generally short-lived and resolve with vagal maneuvers.  Historically, she has a low blood pressure and is not on any beta-blocker therapy.  She is not currently interested in pursuing ablation and is not particularly bothered by symptoms at this time.  She did question whether or not she could be having runs of SVT in the absence of palpitations, that might be contributing to her dyspnea on exertion or chest pain.  Stress echo should help in answering that question.  We did discuss monitoring but  agreed that would likely be low yield, as she did continue to have chest discomfort while in the emergency department in the absence of any SVT.  3.  Mitral valve prolapse: Mild by recent echo.  4.  Disposition: Follow-up stress echocardiogram.  Follow-up with Dr. Fletcher Anon in 3 months or sooner if necessary.   Murray Hodgkins, NP 03/22/2017, 9:39 AM

## 2017-04-17 ENCOUNTER — Other Ambulatory Visit: Payer: 59

## 2017-05-05 ENCOUNTER — Other Ambulatory Visit: Payer: 59

## 2017-05-17 ENCOUNTER — Encounter: Payer: Self-pay | Admitting: Cardiovascular Disease

## 2017-06-15 ENCOUNTER — Telehealth: Payer: Self-pay | Admitting: Cardiovascular Disease

## 2017-06-15 NOTE — Telephone Encounter (Signed)
l mom for pt to schedule stress echo.

## 2017-06-23 ENCOUNTER — Ambulatory Visit: Payer: 59 | Admitting: Cardiovascular Disease

## 2017-07-12 ENCOUNTER — Other Ambulatory Visit: Payer: Self-pay | Admitting: Nurse Practitioner

## 2017-07-12 DIAGNOSIS — I471 Supraventricular tachycardia: Secondary | ICD-10-CM

## 2017-07-20 ENCOUNTER — Ambulatory Visit (INDEPENDENT_AMBULATORY_CARE_PROVIDER_SITE_OTHER): Payer: 59

## 2017-07-20 DIAGNOSIS — I471 Supraventricular tachycardia: Secondary | ICD-10-CM

## 2017-07-21 LAB — ECHOCARDIOGRAM STRESS TEST
CHL CUP MPHR: 177 {beats}/min
CHL CUP RESTING HR STRESS: 63 {beats}/min
CHL RATE OF PERCEIVED EXERTION: 16
CSEPHR: 103 %
Estimated workload: 11.4 METS
Exercise duration (min): 9 min
Exercise duration (sec): 50 s
Peak HR: 184 {beats}/min

## 2017-08-17 ENCOUNTER — Ambulatory Visit: Payer: 59 | Admitting: Cardiovascular Disease

## 2017-08-17 ENCOUNTER — Encounter: Payer: Self-pay | Admitting: Cardiovascular Disease

## 2017-08-17 VITALS — BP 100/62 | HR 84 | Ht 66.0 in | Wt 140.5 lb

## 2017-08-17 DIAGNOSIS — I471 Supraventricular tachycardia: Secondary | ICD-10-CM | POA: Diagnosis not present

## 2017-08-17 DIAGNOSIS — I341 Nonrheumatic mitral (valve) prolapse: Secondary | ICD-10-CM | POA: Diagnosis not present

## 2017-08-17 NOTE — Progress Notes (Signed)
Cardiology Office Note   Date:  08/17/2017   ID:  Sarah Pratt, DOB 08-23-1975, MRN [id number]  PCP:  [name], MD  Cardiologist:   Kathlyn Sacramento, MD   Chief Complaint  Patient presents with  . other    follow up from stress echo & 3 month. Meds reviewed by the pt. verbally. Pt. c/o sinus allergies and would like to know what to take that will not cause any rapid heart beats.       History of Present Illness: Sarah Pratt is a 42 y.o. female who Was referred by Dr.Tullo for evaluation of paroxysmal supraventricular tachycardia and mitral valve prolapse. She has known history of asthma, congenital pectus excavatum and depression.  She has prolonged history of paroxysmal supraventricular tachycardia.  Ablation was considered in the past at [facility] but her episodes became less frequent and thus she has been monitored clinically.   Most recent echocardiogram in June 2018 showed normal LV systolic function with mild mitral valve prolapse with trivial regurgitation. She was seen a few months ago for atypical chest pain and underwent a stress echocardiogram which was unremarkable.  She had more frequent episodes of tachycardia at that time that gradually improved.  She reports having one brief episode per week now lasting less than 1 minute and usually responds to vagal maneuvers.  She does not feel limited by this.  She is a lifelong nonsmoker. She used to work at [employer] but currently works at [employer].   Past Medical History:  Diagnosis Date  . Allergy   . Arthritis   . Asthma   . Chest pain   . Chicken pox   . Congenital pectus excavatum   . Depression   . Dyspnea on exertion   . Mitral valve prolapse    a. 10/2016 Echo: EF 55-60%, no rwma, mild MVP w/ triv MR, nl RV fxn.  Marland Kitchen PSVT (paroxysmal supraventricular tachycardia) ([place])    a. Prev eval @ [facility] - wishes to avoid RFCA @ this time.    Past Surgical History:  Procedure Laterality  Date  . FRACTURE SURGERY Right 08/1998   shoulder fracture  . SHOULDER SURGERY Right 08/1998, 02/1999, 09/2013     Current Outpatient Medications  Medication Sig Dispense Refill  . celecoxib (CELEBREX) 200 MG capsule Take 200 mg by mouth daily.    . Cholecalciferol (D 1000) 1000 units capsule Take 1,000 Units by mouth daily.    Marland Kitchen FLUoxetine (PROZAC) 40 MG capsule Take 1 capsule by mouth daily.  3  . fluticasone (FLONASE) 50 MCG/ACT nasal spray Place 2 sprays into both nostrils daily.    Marland Kitchen levonorgestrel (MIRENA) 20 MCG/24HR IUD by Intrauterine route.     . Turmeric (RA TURMERIC) 500 MG CAPS Take 1,000 mg by mouth daily.     No current facility-administered medications for this visit.     Allergies:   Patient has no known allergies.    Social History:  The patient  reports that she has never smoked. She has never used smokeless tobacco. She reports that she does not drink alcohol or use drugs.   Family History:  The patient's family history includes Arthritis in her father; Atrial fibrillation in her mother; Breast cancer in her maternal aunt; Colon cancer in her maternal grandmother; Diabetes (age of onset: 33) in her mother; Hyperlipidemia in her father and mother; Multiple sclerosis (age of onset: 29) in her sister; Parkinson's disease (age of onset: 22)  in her father; Rheum arthritis (age of onset: 78) in her sister.    ROS:  Please see the history of present illness.   Otherwise, review of systems are positive for none.   All other systems are reviewed and negative.    PHYSICAL EXAM: VS:  BP 100/62 (BP Location: Left Arm, Patient Position: Sitting, Cuff Size: Normal)   Pulse 84   Ht 5\' 6"  (1.676 m)   Wt 140 lb 8 oz (63.7 kg)   BMI 22.68 kg/m  , BMI Body mass index is 22.68 kg/m. GEN: Well nourished, well developed, in no acute distress  HEENT: normal  Neck: no JVD, carotid bruits, or masses Cardiac: RRR; no murmurs, rubs, or gallops,no edema  Respiratory:  clear to  auscultation bilaterally, normal work of breathing GI: soft, nontender, nondistended, + BS MS: no deformity or atrophy  Skin: warm and dry, no rash Neuro:  Strength and sensation are intact Psych: euthymic mood, full affect   EKG:  EKG is not ordered today.    Recent Labs: 09/26/2016: ALT 8 02/21/2017: BUN 14; Creatinine, Ser 0.69; Hemoglobin 15.3; Platelets 198; Potassium 4.2; Sodium 140; TSH 2.373    Lipid Panel    Component Value Date/Time   CHOL 145 09/26/2016 0919   TRIG 56.0 09/26/2016 0919   HDL 46.90 09/26/2016 0919   CHOLHDL 3 09/26/2016 0919   VLDL 11.2 09/26/2016 0919   LDLCALC 87 09/26/2016 0919      Wt Readings from Last 3 Encounters:  08/17/17 140 lb 8 oz (63.7 kg)  03/22/17 135 lb (61.2 kg)  02/21/17 130 lb (59 kg)        PAD Screen 10/31/2016  Previous PAD dx? No  Previous surgical procedure? No  Pain with walking? No  Feet/toe relief with dangling? No  Painful, non-healing ulcers? No  Extremities discolored? No      ASSESSMENT AND PLAN:  1.  Paroxysmal supraventricular tachycardia: Episodes are overall short in duration and respond to vagal maneuvers.  I suggested that she obtained her own smart phone monitor or apple watch.  2. Mitral valve prolapse: No cardiac murmurs by exam.  Echocardiogram last year showed only trivial regurgitation.  3.  Recent atypical chest pain: Negative stress test.   Disposition:   FU with me in 1 year  Signed,  Kathlyn Sacramento, MD  08/17/2017 9:08 AM    [place]

## 2017-08-17 NOTE — Patient Instructions (Signed)
Medication Instructions: No changes.   Labwork: None.   Procedures/Testing: None.   Follow-Up: 1 year with Dr. Fletcher Anon.   Any Additional Special Instructions Will Be Listed Below (If Applicable).  For heart monitor you can obtain the Apple watch 4 or Kardia Alivecor monitor (from [employer]).    If you need a refill on your cardiac medications before your next appointment, please call your pharmacy.

## 2017-08-28 IMAGING — MG MM DIGITAL SCREENING BILAT W/ TOMO W/ CAD
9 of 13 series · 9 of 29 positions shown · non-contrast
Comparison: Previous exam(s).

CLINICAL DATA: Screening.

EXAM:
2D DIGITAL SCREENING BILATERAL MAMMOGRAM WITH CAD AND ADJUNCT TOMO

[L CC (1 of 2)]
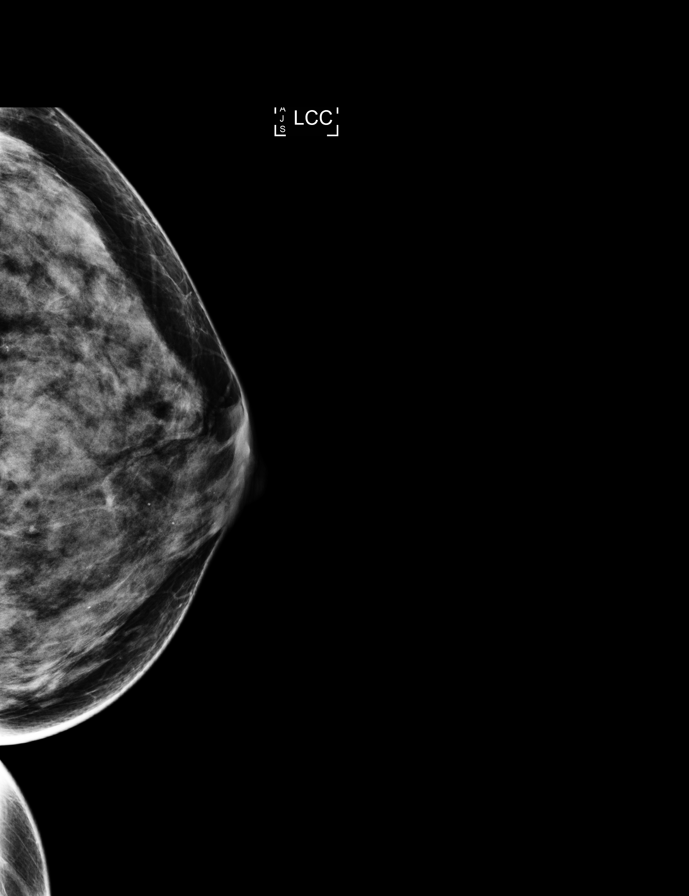

[L CC synth-2D]
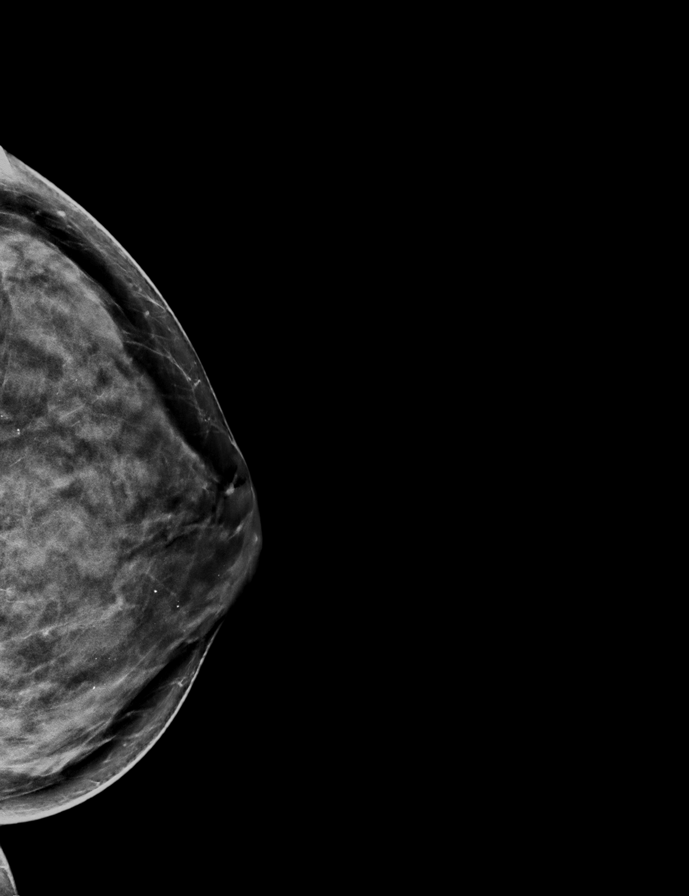

[R MLO synth-2D]
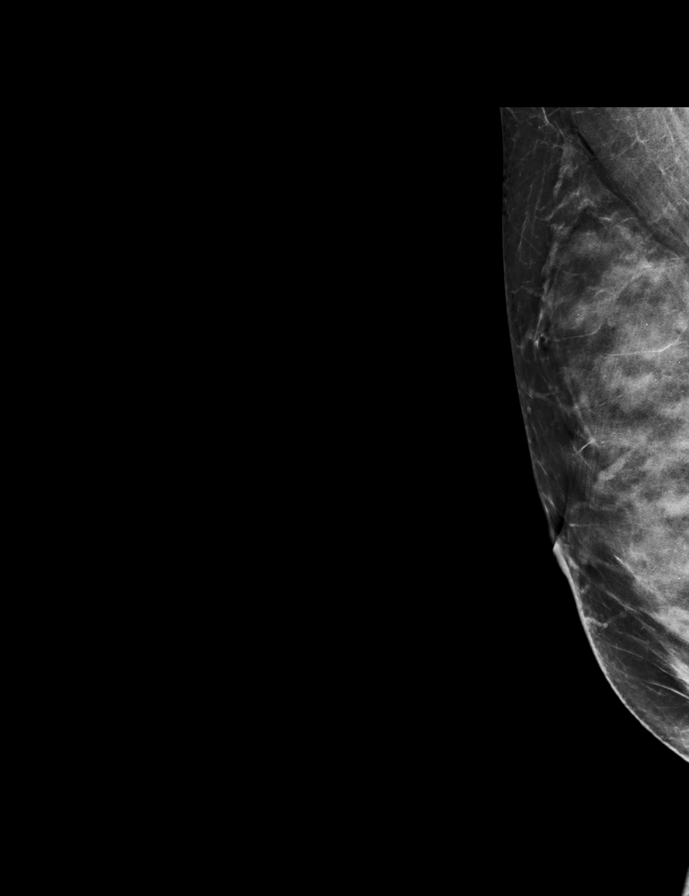

[R CC]
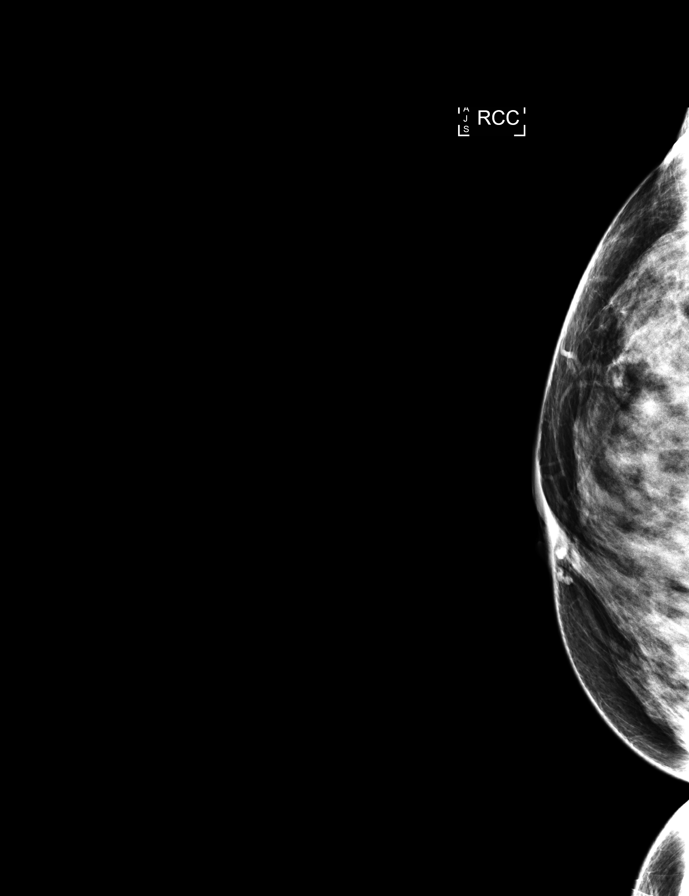

[R CC synth-2D]
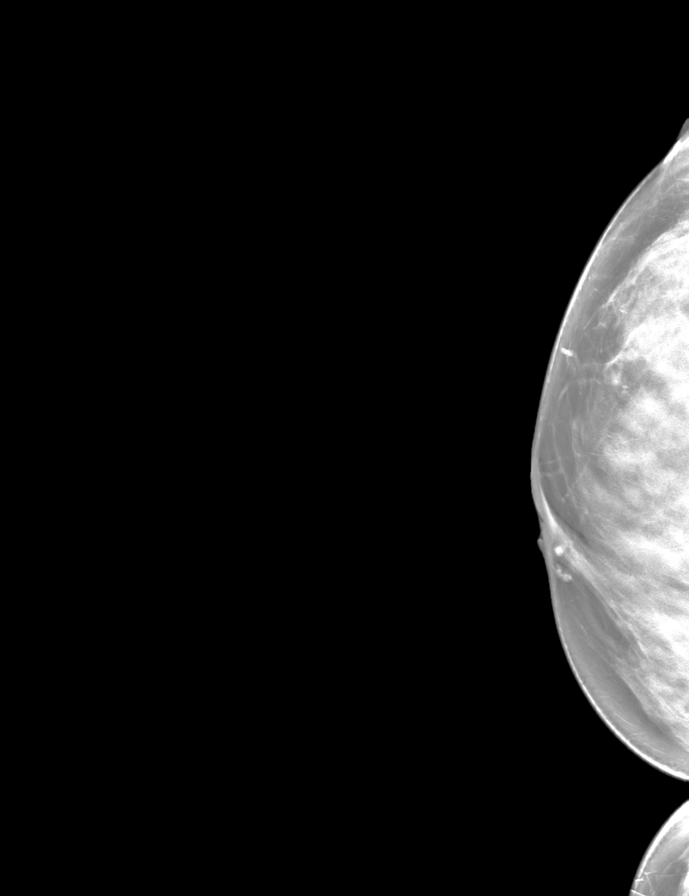

[L MLO]
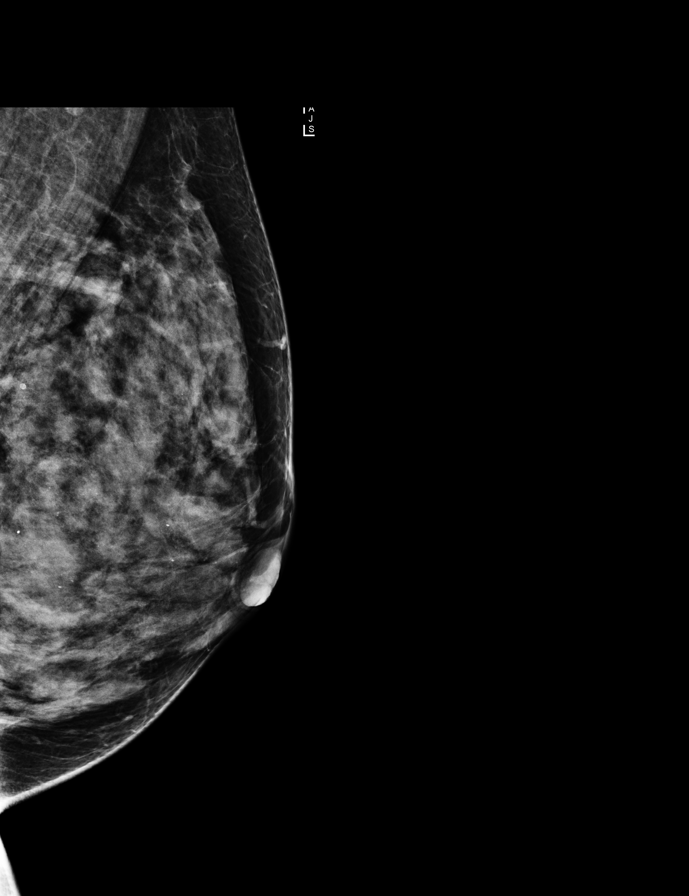

[R MLO]
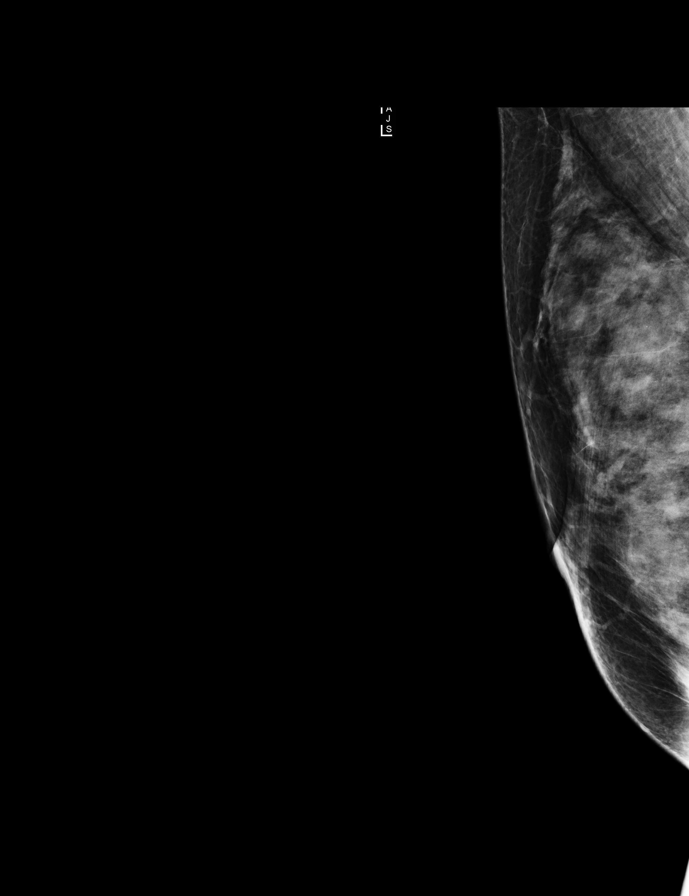

[L MLO synth-2D]
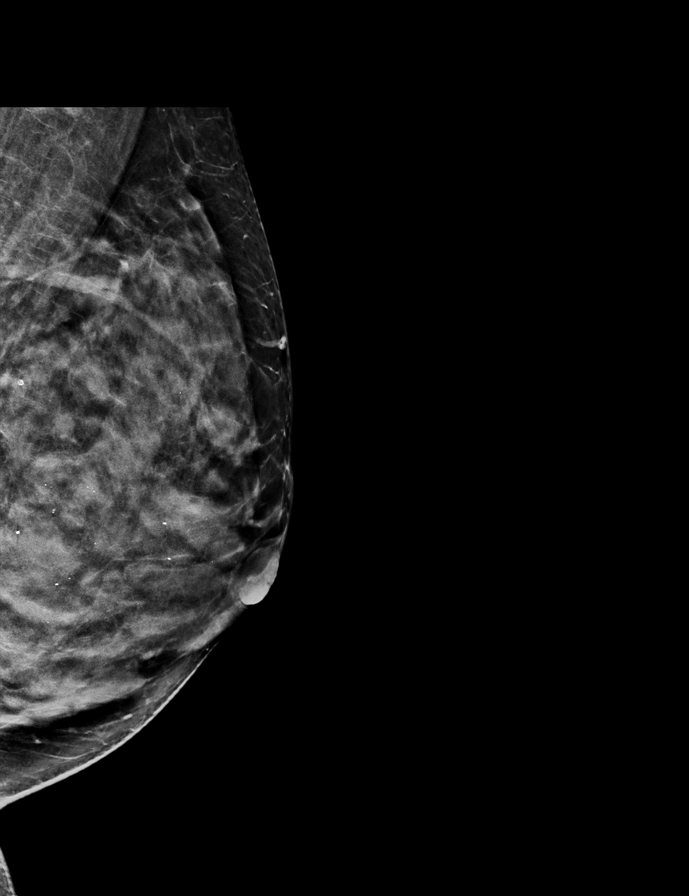

[L CC (2 of 2)]
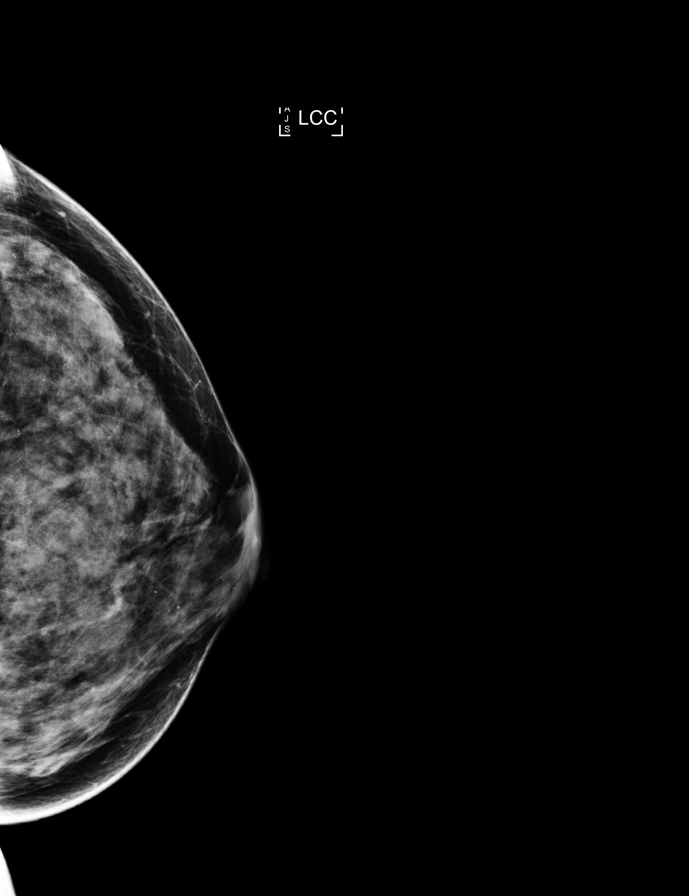

[9 of 29 positions shown; findings below may reference images not displayed]

ACR Breast Density Category d: The breast tissue is extremely dense,
which lowers the sensitivity of mammography.
FINDINGS: There are no findings suspicious for malignancy. Images were
processed with CAD.
IMPRESSION: No mammographic evidence of malignancy. A result letter of this
screening mammogram will be mailed directly to the patient.

RECOMMENDATION:
Screening mammogram in one year. (Code:US-D-RZ7)

BI-RADS CATEGORY  1: Negative.

## 2017-09-27 ENCOUNTER — Encounter: Payer: Self-pay | Admitting: Internal Medicine

## 2017-09-27 ENCOUNTER — Ambulatory Visit (INDEPENDENT_AMBULATORY_CARE_PROVIDER_SITE_OTHER): Payer: 59 | Admitting: Internal Medicine

## 2017-09-27 VITALS — BP 100/58 | HR 79 | Temp 98.4°F | Resp 14 | Ht 66.0 in | Wt 140.4 lb

## 2017-09-27 DIAGNOSIS — Z0000 Encounter for general adult medical examination without abnormal findings: Secondary | ICD-10-CM | POA: Diagnosis not present

## 2017-09-27 DIAGNOSIS — E782 Mixed hyperlipidemia: Secondary | ICD-10-CM | POA: Diagnosis not present

## 2017-09-27 DIAGNOSIS — R5383 Other fatigue: Secondary | ICD-10-CM | POA: Diagnosis not present

## 2017-09-27 DIAGNOSIS — Z975 Presence of (intrauterine) contraceptive device: Secondary | ICD-10-CM

## 2017-09-27 DIAGNOSIS — D582 Other hemoglobinopathies: Secondary | ICD-10-CM

## 2017-09-27 LAB — COMPREHENSIVE METABOLIC PANEL
ALBUMIN: 4.3 g/dL (ref 3.5–5.2)
ALK PHOS: 56 U/L (ref 39–117)
ALT: 7 U/L (ref 0–35)
AST: 11 U/L (ref 0–37)
BILIRUBIN TOTAL: 1.2 mg/dL (ref 0.2–1.2)
BUN: 14 mg/dL (ref 6–23)
CO2: 32 mEq/L (ref 19–32)
CREATININE: 0.83 mg/dL (ref 0.40–1.20)
Calcium: 9.4 mg/dL (ref 8.4–10.5)
Chloride: 102 mEq/L (ref 96–112)
GFR: 79.44 mL/min (ref >60.00)
Glucose, Bld: 101 mg/dL — ABNORMAL HIGH (ref 70–99)
POTASSIUM: 4.1 meq/L (ref 3.5–5.1)
Sodium: 141 mEq/L (ref 135–145)
TOTAL PROTEIN: 7.3 g/dL (ref 6.0–8.3)

## 2017-09-27 LAB — CBC WITH DIFFERENTIAL/PLATELET
BASOS ABS: 0.1 10*3/uL (ref 0.0–0.1)
Basophils Relative: 0.8 % (ref 0.0–3.0)
EOS ABS: 0.3 10*3/uL (ref 0.0–0.7)
Eosinophils Relative: 3.9 % (ref 0.0–5.0)
HCT: 44 % (ref 36.0–46.0)
Hemoglobin: 15.4 g/dL — ABNORMAL HIGH (ref 12.0–15.0)
Lymphocytes Relative: 17.5 % (ref 12.0–46.0)
Lymphs Abs: 1.2 10*3/uL (ref 0.7–4.0)
MCHC: 35.1 g/dL (ref 30.0–36.0)
MCV: 90.3 fl (ref 78.0–100.0)
MONO ABS: 0.5 10*3/uL (ref 0.1–1.0)
MONOS PCT: 8.1 % (ref 3.0–12.0)
NEUTROS PCT: 69.7 % (ref 43.0–77.0)
Neutro Abs: 4.6 10*3/uL (ref 1.4–7.7)
Platelets: 223 10*3/uL (ref 150.0–400.0)
RBC: 4.87 Mil/uL (ref 3.87–5.11)
RDW: 13 % (ref 11.5–15.5)
WBC: 6.6 10*3/uL (ref 4.0–10.5)

## 2017-09-27 LAB — LIPID PANEL
CHOLESTEROL: 171 mg/dL (ref 0–200)
HDL: 50.7 mg/dL (ref >39.00)
LDL Cholesterol: 106 mg/dL — ABNORMAL HIGH (ref 0–99)
NonHDL: 120.57
Total CHOL/HDL Ratio: 3
Triglycerides: 71 mg/dL (ref 0.0–149.0)
VLDL: 14.2 mg/dL (ref 0.0–40.0)

## 2017-09-27 LAB — TSH: TSH: 1.22 u[IU]/mL (ref 0.35–4.50)

## 2017-09-27 MED ORDER — ALPRAZOLAM 0.25 MG PO TABS: 0.2500 mg | ORAL_TABLET | Freq: Two times a day (BID) | ORAL | 0 refills | Status: DC | PRN

## 2017-09-27 MED ORDER — DILTIAZEM HCL 30 MG PO TABS: 30.0000 mg | ORAL_TABLET | Freq: Four times a day (QID) | ORAL | 1 refills | Status: DC

## 2017-09-27 NOTE — Progress Notes (Signed)
Patient ID: Sarah Pratt, female    DOB: 1975/07/30  Age: 42 y.o. MRN: [id number]  The patient is here for annual examination and management of other chronic and acute problems.   The risk factors are reflected in the social history.  The roster of all physicians providing medical care to patient - is listed in the Snapshot section of the chart.  Activities of daily living:  The patient is 100% independent in all ADLs: dressing, toileting, feeding as well as independent mobility  Home safety : The patient has smoke detectors in the home. They wear seatbelts.  There are no firearms at home. There is no violence in the home.   There is no risks for hepatitis, STDs or HIV. There is no   history of blood transfusion. They have no travel history to infectious disease endemic areas of the world.  The patient has seen their dentist in the last six month. They have seen their eye doctor in the last year.  They do not  have excessive sun exposure. Discussed the need for sun protection: hats, long sleeves and use of sunscreen if there is significant sun exposure.   Diet: the importance of a healthy diet is discussed. They do have a healthy diet.  The benefits of regular aerobic exercise were discussed. She walks 4 times per week ,  20 minutes.   Depression screen: there are no signs or vegative symptoms of depression- irritability, change in appetite, anhedonia, sadness/tearfullness.   The following portions of the patient's history were reviewed and updated as appropriate: allergies, current medications, past family history, past medical history,  past surgical history, past social history  and problem list.  Visual acuity was not assessed per patient preference since she has regular follow up with her ophthalmologist. Hearing and body mass index were assessed and reviewed.   During the course of the visit the patient was educated and counseled about appropriate screening and preventive services  including : fall prevention , diabetes screening, nutrition counseling, colorectal cancer screening, and recommended immunizations.    CC: The primary encounter diagnosis was IUD contraception. Diagnoses of Mixed hyperlipidemia, Fatigue, unspecified type, Elevated hemoglobin ([place]), Encounter for preventive health examination, and IUD (intrauterine device) in place were also pertinent to this visit.   rightshoulder pain,  Better with celebrex,  Stopped by Ortho due to chronicity.  Liver enzymes normal  discussed resuming it if lfts are normal.  Tearful about her pain,  Her desire to avoid another surgery.  Weight gain ,  Concerned about flabby shape.  BMI reviewed.  Not exercising .   Discussed ab exercise..  Normal BMI  Chest pain,, PSVT: seeing Arida  After ER visit In October . Mild pectus excavatuam by chest x ray. Stress echo  Normal. Has PSVT once a week  Lasts less than 30 secs   History Sarah Pratt has a past medical history of Allergy, Arthritis, Asthma, Chest pain, Chicken pox, Congenital pectus excavatum, Depression, Dyspnea on exertion, Mitral valve prolapse, and PSVT (paroxysmal supraventricular tachycardia) ([place]).   She has a past surgical history that includes Fracture surgery (Right, 08/1998) and Shoulder surgery (Right, 08/1998, 02/1999, 09/2013).   Her family history includes Arthritis in her father; Atrial fibrillation in her mother; Breast cancer in her maternal aunt; Colon cancer in her maternal grandmother; Diabetes (age of onset: 73) in her mother; Hyperlipidemia in her father and mother; Multiple sclerosis (age of onset: 70) in her sister; Parkinson's disease (age of onset: 53) in her  father; Rheum arthritis (age of onset: 63) in her sister.She reports that she has never smoked. She has never used smokeless tobacco. She reports that she does not drink alcohol or use drugs.  Outpatient Medications Prior to Visit  Medication Sig Dispense Refill  . Cholecalciferol (D 1000) 1000 units  capsule Take 1,000 Units by mouth daily.    Marland Kitchen FLUoxetine (PROZAC) 40 MG capsule Take 1 capsule by mouth daily.  3  . fluticasone (FLONASE) 50 MCG/ACT nasal spray Place 2 sprays into both nostrils daily.    Marland Kitchen levonorgestrel (MIRENA) 20 MCG/24HR IUD by Intrauterine route.     . Turmeric (RA TURMERIC) 500 MG CAPS Take 1,000 mg by mouth daily.    . celecoxib (CELEBREX) 200 MG capsule Take 200 mg by mouth daily.     No facility-administered medications prior to visit.     Review of Systems  Patient denies headache, fevers, malaise, unintentional weight loss, skin rash, eye pain, sinus congestion and sinus pain, sore throat, dysphagia,  hemoptysis , cough, dyspnea, wheezing, chest pain, palpitations, orthopnea, edema, abdominal pain, nausea, melena, diarrhea, constipation, flank pain, dysuria, hematuria, urinary  Frequency, nocturia, numbness, tingling, seizures,  Focal weakness, Loss of consciousness,  Tremor, insomnia, depression, anxiety, and suicidal ideation.     Objective:  BP (!) 100/58 (BP Location: Left Arm, Patient Position: Sitting, Cuff Size: Normal)   Pulse 79   Temp 98.4 F (36.9 C) (Oral)   Resp 14   Ht 5\' 6"  (1.676 m)   Wt 140 lb 6.4 oz (63.7 kg)   SpO2 98%   BMI 22.66 kg/m   Physical Exam   General appearance: alert, cooperative and appears stated age Head: Normocephalic, without obvious abnormality, atraumatic Eyes: conjunctivae/corneas clear. PERRL, EOM's intact. Fundi benign. Ears: normal TM's and external ear canals both ears Nose: Nares normal. Septum midline. Mucosa normal. No drainage or sinus tenderness. Throat: lips, mucosa, and tongue normal; teeth and gums normal Neck: no adenopathy, no carotid bruit, no JVD, supple, symmetrical, trachea midline and thyroid not enlarged, symmetric, no tenderness/mass/nodules Lungs: clear to auscultation bilaterally Breasts: normal appearance, no masses or tenderness Heart: regular rate and rhythm, S1, S2 normal, no murmur,  click, rub or gallop Abdomen: soft, non-tender; bowel sounds normal; no masses,  no organomegaly Extremities: extremities normal, atraumatic, no cyanosis or edema Pulses: 2+ and symmetric Skin: Skin color, texture, turgor normal. No rashes or lesions Neurologic: Alert and oriented X 3, normal strength and tone. Normal symmetric reflexes. Normal coordination and gait.      Assessment & Plan:   Problem List Items Addressed This Visit    Hyperlipidemia   Relevant Medications   diltiazem (CARDIZEM) 30 MG tablet   Other Relevant Orders   Lipid panel (Completed)   IUD (intrauterine device) in place    Referral to GYN for IUD replacement       Encounter for preventive health examination    Annual comprehensive preventive exam was done as well as an evaluation and management of chronic conditions .  During the course of the visit the patient was educated and counseled about appropriate screening and preventive services including :  diabetes screening, lipid analysis with projected  10 year  risk for CAD , nutrition counseling, breast, cervical and colorectal cancer screening, and recommended immunizations.  Printed recommendations for health maintenance screenings was given  Lab Results  Component Value Date   CHOL 171 09/27/2017   HDL 50.70 09/27/2017   LDLCALC 106 (H) 09/27/2017   TRIG  71.0 09/27/2017   CHOLHDL 3 09/27/2017         Elevated hemoglobin ([facility])    Etiology unclear.  May be hemoconcentration from fasting. Repeat in 3 months with iron studies.       Relevant Orders   CBC with Differential/Platelet   Iron, TIBC and Ferritin Panel    Other Visit Diagnoses    IUD contraception    -  Primary   Relevant Orders   Ambulatory referral to Gynecology   Fatigue, unspecified type       Relevant Orders   Comprehensive metabolic panel (Completed)   TSH (Completed)   CBC with Differential/Platelet (Completed)      I have changed Sarah Pratt's celecoxib. I am also  having her start on diltiazem and ALPRAZolam. Additionally, I am having her maintain her levonorgestrel, fluticasone, FLUoxetine, Cholecalciferol, and Turmeric.  Meds ordered this encounter  Medications  . diltiazem (CARDIZEM) 30 MG tablet    Sig: Take 1 tablet (30 mg total) by mouth 4 (four) times daily. As needed for PSVT    Dispense:  30 tablet    Refill:  1  . ALPRAZolam (XANAX) 0.25 MG tablet    Sig: Take 1 tablet (0.25 mg total) by mouth 2 (two) times daily as needed for anxiety.    Dispense:  20 tablet    Refill:  0  . celecoxib (CELEBREX) 200 MG capsule    Sig: Take 1 capsule (200 mg total) by mouth daily.    Dispense:  90 capsule    Refill:  1    Medications Discontinued During This Encounter  Medication Reason  . celecoxib (CELEBREX) 200 MG capsule Patient has not taken in last 30 days    Follow-up: No follow-ups on file.   [name], MD

## 2017-09-27 NOTE — Patient Instructions (Signed)

## 2017-09-29 DIAGNOSIS — D582 Other hemoglobinopathies: Secondary | ICD-10-CM | POA: Insufficient documentation

## 2017-09-29 MED ORDER — CELECOXIB 200 MG PO CAPS: 200.0000 mg | ORAL_CAPSULE | Freq: Every day | ORAL | 1 refills | Status: DC

## 2017-09-29 NOTE — Assessment & Plan Note (Signed)
Referral to GYN for IUD replacement

## 2017-09-29 NOTE — Assessment & Plan Note (Signed)
Etiology unclear.  May be hemoconcentration from fasting. Repeat in 3 months with iron studies.

## 2017-09-29 NOTE — Assessment & Plan Note (Signed)
Annual comprehensive preventive exam was done as well as an evaluation and management of chronic conditions .  During the course of the visit the patient was educated and counseled about appropriate screening and preventive services including :  diabetes screening, lipid analysis with projected  10 year  risk for CAD , nutrition counseling, breast, cervical and colorectal cancer screening, and recommended immunizations.  Printed recommendations for health maintenance screenings was given  Lab Results  Component Value Date   CHOL 171 09/27/2017   HDL 50.70 09/27/2017   LDLCALC 106 (H) 09/27/2017   TRIG 71.0 09/27/2017   CHOLHDL 3 09/27/2017

## 2017-10-16 IMAGING — CR DG CHEST 2V
2 series · 2 of 2 positions shown · non-contrast
Comparison: None.

CLINICAL DATA: Shortness of breath for 1 week

EXAM:
CHEST  2 VIEW

[chest pa]
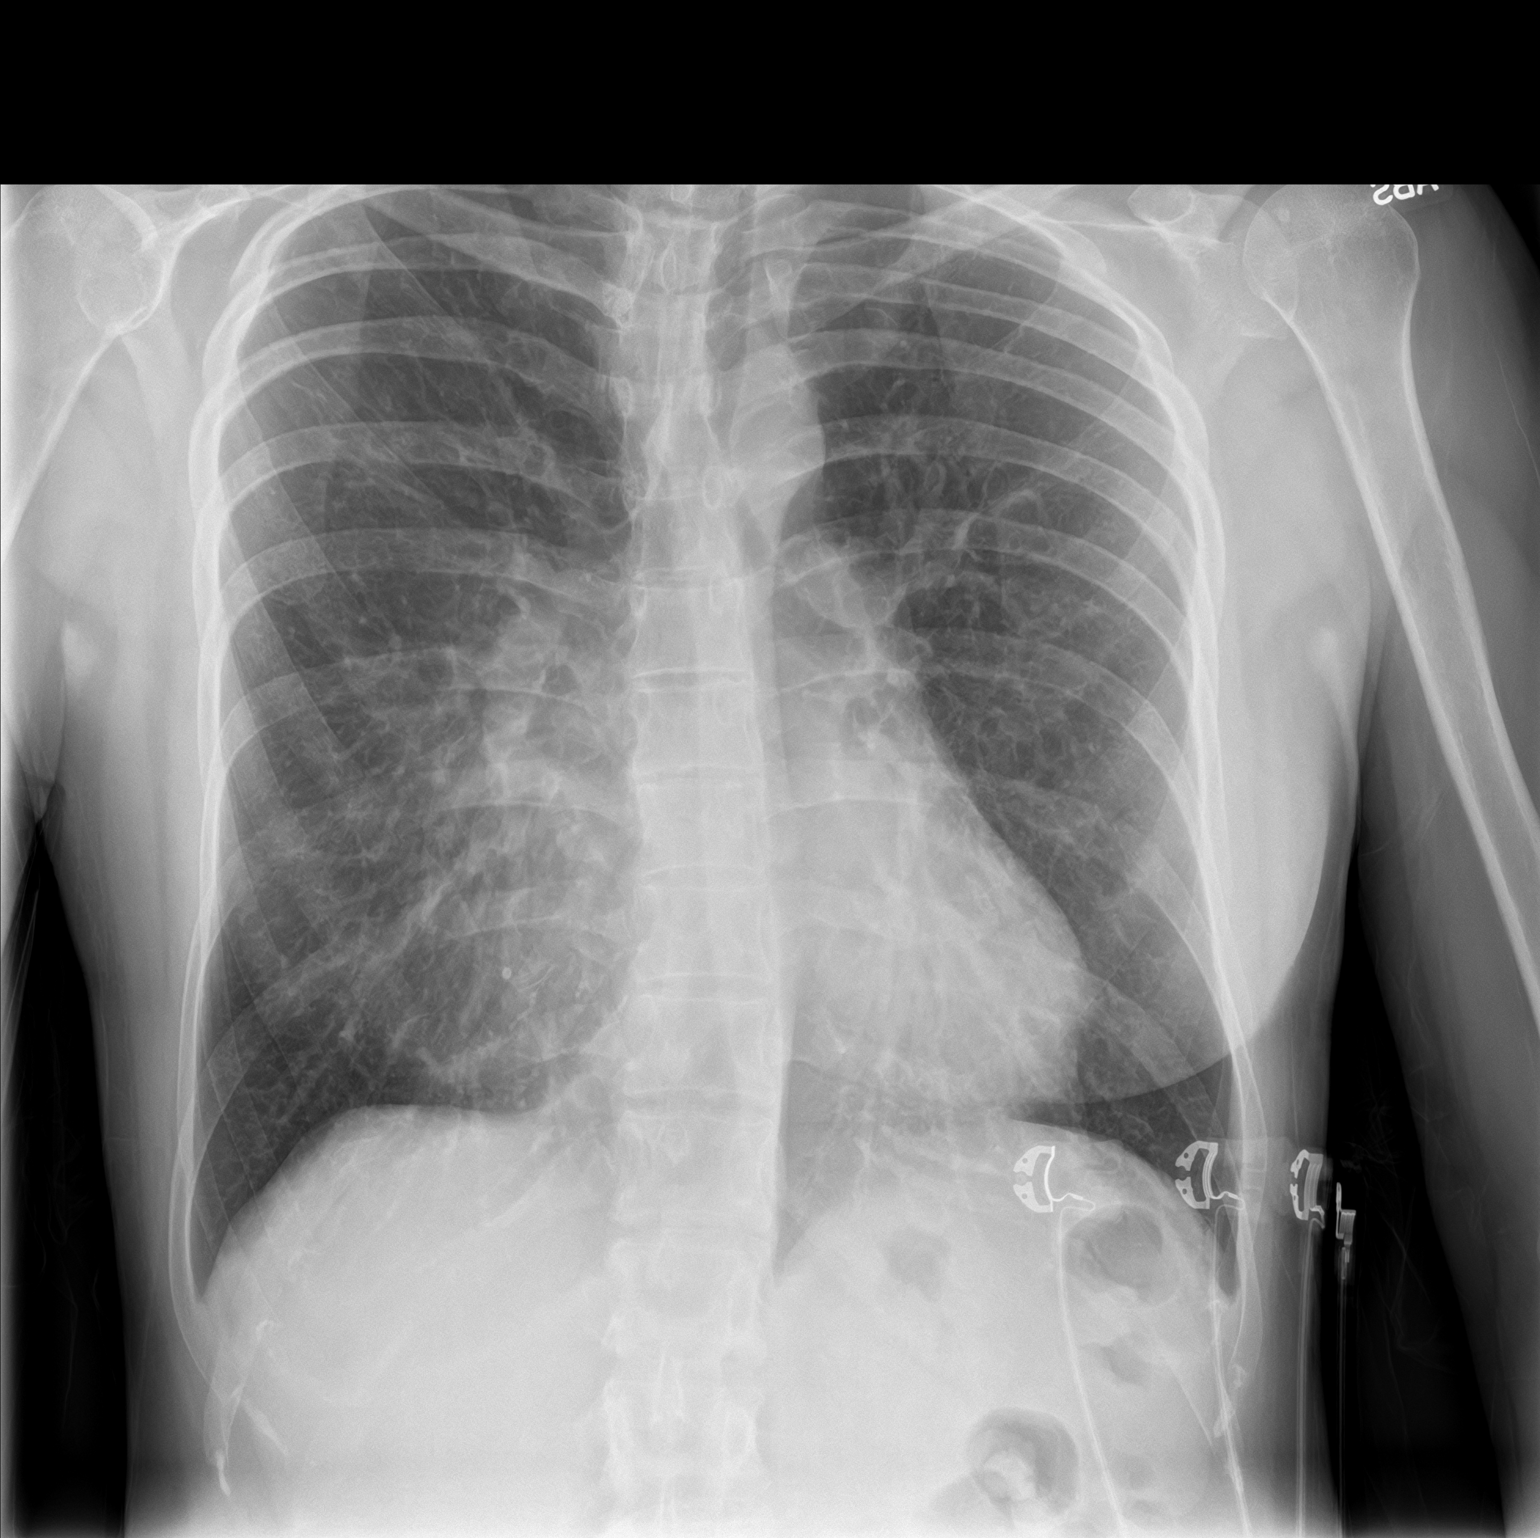

[chest lat]
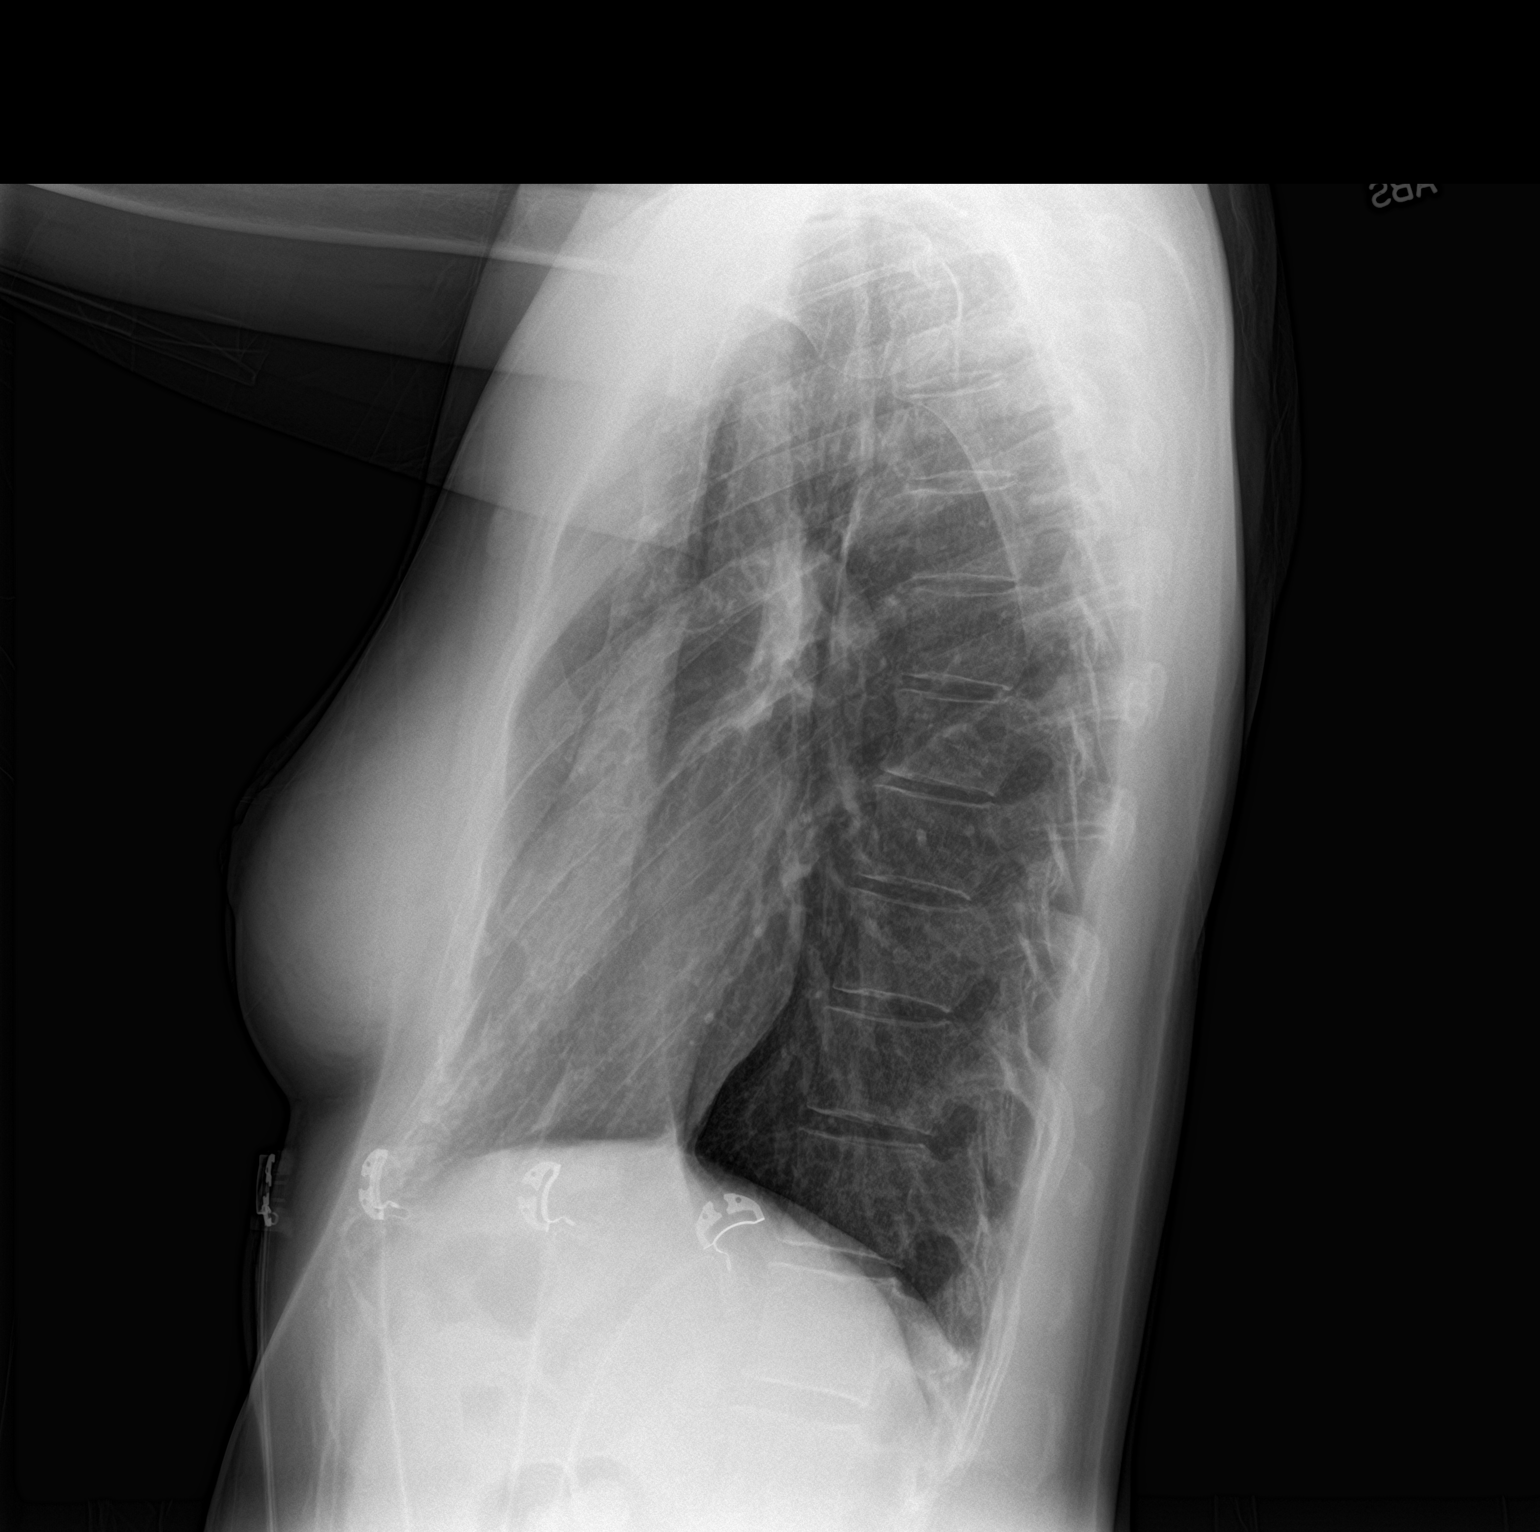

[2 of 2 positions shown; findings below may reference images not displayed]

FINDINGS: Cardiac shadow is within normal limits. Mild pectus excavatum is
noted. This accentuates the mediastinal markings. No focal
infiltrate or sizable effusion is seen. No acute bony abnormality is
noted.
IMPRESSION: No acute abnormality noted.

Pectus excavatum accentuating the mediastinal markings.

## 2017-10-27 ENCOUNTER — Ambulatory Visit: Payer: Self-pay | Admitting: Certified Nurse Midwife

## 2017-11-01 ENCOUNTER — Ambulatory Visit (INDEPENDENT_AMBULATORY_CARE_PROVIDER_SITE_OTHER): Payer: 59 | Admitting: Certified Nurse Midwife

## 2017-11-01 VITALS — BP 106/53 | HR 65 | Ht 66.0 in | Wt 143.2 lb

## 2017-11-01 DIAGNOSIS — Z30433 Encounter for removal and reinsertion of intrauterine contraceptive device: Secondary | ICD-10-CM

## 2017-11-01 NOTE — Progress Notes (Signed)
Referral from Dr Derrel Nip for extraction and reinsertion of Mirena IUD. Informed consent signed.

## 2017-11-01 NOTE — Patient Instructions (Signed)

## 2017-11-01 NOTE — Progress Notes (Signed)
  GYNECOLOGY OFFICE PROCEDURE NOTE  Sarah Pratt is a 42 y.o. G2P2 here for mirena IUD removal and reinsertion. No GYN concerns.    IUD Removal and Reinsertion  Patient identified, informed consent performed, consent signed.   Discussed risks of irregular bleeding, cramping, infection, malpositioning or misplacement of the IUD outside the uterus which may require further procedures. Also advised to use backup contraception for one week as the risk of pregnancy is higher during the transition period of removing an IUD and replacing it with another one. Time out was performed. Speculum placed in the vagina. The strings of the IUD were grasped and pulled using ring forceps. The IUD was successfully removed in its entirety. The cervix was cleaned with Betadine x 2 and grasped anteriorly with a single tooth tenaculum.  The new Mirena IUD insertion apparatus was used to sound the uterus to 7.5 cm;  the IUD was then placed per manufacturer's recommendations. Strings trimmed to 3 cm. Tenaculum was removed, good hemostasis noted. Patient tolerated procedure well.   Patient was given post-procedure instructions.  She was reminded to have backup contraception for one week during this transition period between IUDs.  Patient was also asked to check IUD strings periodically and follow up in 4 weeks for IUD check.   Philip Aspen, CNM

## 2017-12-04 ENCOUNTER — Encounter: Payer: Self-pay | Admitting: Certified Nurse Midwife

## 2017-12-04 ENCOUNTER — Ambulatory Visit (INDEPENDENT_AMBULATORY_CARE_PROVIDER_SITE_OTHER): Payer: 59 | Admitting: Certified Nurse Midwife

## 2017-12-04 VITALS — BP 103/59 | HR 76 | Wt 142.0 lb

## 2017-12-04 DIAGNOSIS — Z30431 Encounter for routine checking of intrauterine contraceptive device: Secondary | ICD-10-CM

## 2017-12-04 NOTE — Patient Instructions (Signed)

## 2017-12-04 NOTE — Progress Notes (Signed)
   GYNECOLOGY OFFICE PROGRESS NOTE  History:  42 y.o. G2P2 here today for today for IUD string check; Mirena IUD was placed  11/01/17 No complaints about the IUD, no concerning side effects.  The following portions of the patient's history were reviewed and updated as appropriate: allergies, current medications, past family history, past medical history, past social history, past surgical history and problem list. Last pap smear on 10/01/2015 was normal, HPV neg.  Review of Systems:  Pertinent items are noted in HPI.   Objective:  Physical Exam Blood pressure (!) 103/59, pulse 76, weight 142 lb (64.4 kg). CONSTITUTIONAL: Well-developed, well-nourished female in no acute distress.  HENT:  Normocephalic, atraumatic. External right and left ear normal. Oropharynx is clear and moist EYES: Conjunctivae and EOM are normal. Pupils are equal, round, and reactive to light. No scleral icterus.  NECK: Normal range of motion, supple, no masses CARDIOVASCULAR: Normal heart rate noted RESPIRATORY: Effort and breath sounds normal, no problems with respiration noted ABDOMEN: Soft, no distention noted.   PELVIC: Normal appearing external genitalia; normal appearing vaginal mucosa and cervix.  IUD strings visualized, about 3 cm in length outside cervix.   Assessment & Plan:  Normal IUD check. Patient to keep IUD in place for five years; can come in for removal if she desires pregnancy within the next five years. Routine preventative health maintenance measures emphasized.   Philip Aspen, CNM

## 2018-01-05 DIAGNOSIS — D2262 Melanocytic nevi of left upper limb, including shoulder: Secondary | ICD-10-CM | POA: Diagnosis not present

## 2018-01-05 DIAGNOSIS — D2261 Melanocytic nevi of right upper limb, including shoulder: Secondary | ICD-10-CM | POA: Diagnosis not present

## 2018-01-05 DIAGNOSIS — D2272 Melanocytic nevi of left lower limb, including hip: Secondary | ICD-10-CM | POA: Diagnosis not present

## 2018-01-05 DIAGNOSIS — D225 Melanocytic nevi of trunk: Secondary | ICD-10-CM | POA: Diagnosis not present

## 2018-01-05 DIAGNOSIS — D2271 Melanocytic nevi of right lower limb, including hip: Secondary | ICD-10-CM | POA: Diagnosis not present

## 2018-02-20 ENCOUNTER — Other Ambulatory Visit: Payer: Self-pay

## 2018-02-20 MED ORDER — FLUOXETINE HCL 40 MG PO CAPS: 40.0000 mg | ORAL_CAPSULE | Freq: Every day | ORAL | 1 refills | Status: DC

## 2018-02-20 NOTE — Telephone Encounter (Signed)
Refilled: 08/11/2015 Last OV: 09/27/2017 Next OV: 10/03/2018

## 2018-10-03 ENCOUNTER — Encounter: Payer: 59 | Admitting: Internal Medicine

## 2018-10-09 ENCOUNTER — Telehealth: Payer: Self-pay

## 2018-10-09 NOTE — Telephone Encounter (Signed)
Virtual Visit Pre-Appointment Phone Call  "Sarah Pratt, I am calling you today to discuss your upcoming appointment. We are currently trying to limit exposure to the virus that causes COVID-19 by seeing patients at home rather than in the office."  1. "What is the BEST phone number to call the day of the visit?" - include this in appointment notes  2. "Do you have or have access to (through a family member/friend) a smartphone with video capability that we can use for your visit?" a. If yes - list this number in appt notes as "cell" (if different from BEST phone #) and list the appointment type as a VIDEO visit in appointment notes b. If no - list the appointment type as a PHONE visit in appointment notes  3. Confirm consent - "In the setting of the current Covid19 crisis, you are scheduled for a video visit with your provider on 10/10/2018 at 9:30AM.  Just as we do with many in-office visits, in order for you to participate in this visit, we must obtain consent.  If you'd like, I can send this to your mychart (if signed up) or email for you to review.  Otherwise, I can obtain your verbal consent now.  All virtual visits are billed to your insurance company just like a normal visit would be.  By agreeing to a virtual visit, we'd like you to understand that the technology does not allow for your provider to perform an examination, and thus may limit your provider's ability to fully assess your condition. If your provider identifies any concerns that need to be evaluated in person, we will make arrangements to do so.  Finally, though the technology is pretty good, we cannot assure that it will always work on either your or our end, and in the setting of a video visit, we may have to convert it to a phone-only visit.  In either situation, we cannot ensure that we have a secure connection.  Are you willing to proceed?" STAFF: Did the patient verbally acknowledge consent to telehealth visit? Document YES/NO  here: YES  4. Advise patient to be prepared - "Two hours prior to your appointment, go ahead and check your blood pressure, pulse, oxygen saturation, and your weight (if you have the equipment to check those) and write them all down. When your visit starts, your provider will ask you for this information. If you have an Apple Watch or Kardia device, please plan to have heart rate information ready on the day of your appointment. Please have a pen and paper handy nearby the day of the visit as well."  5. Give patient instructions for MyChart download to smartphone OR Doximity/Doxy.me as below if video visit (depending on what platform provider is using)  6. Inform patient they will receive a phone call 15 minutes prior to their appointment time (may be from unknown caller ID) so they should be prepared to answer    TELEPHONE CALL NOTE  Sarah Pratt has been deemed a candidate for a follow-up tele-health visit to limit community exposure during the Covid-19 pandemic. I spoke with the patient via phone to ensure availability of phone/video source, confirm preferred email & phone number, and discuss instructions and expectations.  I reminded Sarah Pratt to be prepared with any vital sign and/or heart rhythm information that could potentially be obtained via home monitoring, at the time of her visit. I reminded Sarah Pratt to expect a phone call prior to her visit.  Rene Paci McClain 10/09/2018 3:10 PM   INSTRUCTIONS FOR DOWNLOADING THE MYCHART APP TO SMARTPHONE  - The patient must first make sure to have activated MyChart and know their login information - If Apple, go to [employer] and type in MyChart in the search bar and download the app. If Android, ask patient to go to [employer] and type in [place] in the search bar and download the app. The app is free but as with any other app downloads, their phone may require them to verify saved payment information or  Apple/Android password.  - The patient will need to then log into the app with their MyChart username and password, and select [facility] as their healthcare provider to link the account. When it is time for your visit, go to the MyChart app, find appointments, and click Begin Video Visit. Be sure to Select Allow for your device to access the Microphone and Camera for your visit. You will then be connected, and your provider will be with you shortly.  **If they have any issues connecting, or need assistance please contact MyChart service desk [phone])**  **If using a computer, in order to ensure the best quality for their visit they will need to use either of the following Internet Browsers: [employer], or Google Chrome**  IF USING DOXIMITY or DOXY.ME - The patient will receive a link just prior to their visit by text.     FULL LENGTH CONSENT FOR TELE-HEALTH VISIT   I hereby voluntarily request, consent and authorize [facility] and its employed or contracted physicians, physician assistants, nurse practitioners or other licensed health care professionals (the Practitioner), to provide me with telemedicine health care services (the "Services") as deemed necessary by the treating Practitioner. I acknowledge and consent to receive the Services by the Practitioner via telemedicine. I understand that the telemedicine visit will involve communicating with the Practitioner through live audiovisual communication technology and the disclosure of certain medical information by electronic transmission. I acknowledge that I have been given the opportunity to request an in-person assessment or other available alternative prior to the telemedicine visit and am voluntarily participating in the telemedicine visit.  I understand that I have the right to withhold or withdraw my consent to the use of telemedicine in the course of my care at any time, without affecting my right to future care  or treatment, and that the Practitioner or I may terminate the telemedicine visit at any time. I understand that I have the right to inspect all information obtained and/or recorded in the course of the telemedicine visit and may receive copies of available information for a reasonable fee.  I understand that some of the potential risks of receiving the Services via telemedicine include:  Marland Kitchen Delay or interruption in medical evaluation due to technological equipment failure or disruption; . Information transmitted may not be sufficient (e.g. poor resolution of images) to allow for appropriate medical decision making by the Practitioner; and/or  . In rare instances, security protocols could fail, causing a breach of personal health information.  Furthermore, I acknowledge that it is my responsibility to provide information about my medical history, conditions and care that is complete and accurate to the best of my ability. I acknowledge that Practitioner's advice, recommendations, and/or decision may be based on factors not within their control, such as incomplete or inaccurate data provided by me or distortions of diagnostic images or specimens that may result from electronic transmissions. I understand that  the practice of medicine is not an exact science and that Practitioner makes no warranties or guarantees regarding treatment outcomes. I acknowledge that I will receive a copy of this consent concurrently upon execution via email to the email address I last provided but may also request a printed copy by calling the office of [facility].    I understand that my insurance will be billed for this visit.   I have read or had this consent read to me. . I understand the contents of this consent, which adequately explains the benefits and risks of the Services being provided via telemedicine.  . I have been provided ample opportunity to ask questions regarding this consent and the Services and have had  my questions answered to my satisfaction. . I give my informed consent for the services to be provided through the use of telemedicine in my medical care  By participating in this telemedicine visit I agree to the above.

## 2018-10-10 ENCOUNTER — Other Ambulatory Visit: Payer: Self-pay

## 2018-10-10 ENCOUNTER — Telehealth (INDEPENDENT_AMBULATORY_CARE_PROVIDER_SITE_OTHER): Payer: 59 | Admitting: Nurse Practitioner

## 2018-10-10 ENCOUNTER — Encounter: Payer: Self-pay | Admitting: Nurse Practitioner

## 2018-10-10 VITALS — BP 110/85 | HR 76 | Ht 66.5 in | Wt 133.0 lb

## 2018-10-10 DIAGNOSIS — I471 Supraventricular tachycardia: Secondary | ICD-10-CM | POA: Diagnosis not present

## 2018-10-10 DIAGNOSIS — R5383 Other fatigue: Secondary | ICD-10-CM

## 2018-10-10 DIAGNOSIS — Z0000 Encounter for general adult medical examination without abnormal findings: Secondary | ICD-10-CM

## 2018-10-10 NOTE — Patient Instructions (Addendum)
Medication Instructions:  - Your physician recommends that you continue on your current medications as directed. Please refer to the Current Medication list given to you today.  If you need a refill on your cardiac medications before your next appointment, please call your pharmacy.   Lab work: - Your physician recommends that you have FASTING lab work: CBC/ CMET/ Lipid/ TSH Please go to the [employer], 1st desk on the right to check in Labs are done on a walk in basis: Monday-Friday (7:30 am- 5:00 pm) Please do not eat/ drink anything for 8 hours prior to you lab draw (except for water/ black coffee NO cream/ sugar are ok)  If you have labs (blood work) drawn today and your tests are completely normal, you will receive your results only by: Marland Kitchen MyChart Message (if you have MyChart) OR . A paper copy in the mail If you have any lab test that is abnormal or we need to change your treatment, we will call you to review the results.  Testing/Procedures: - Your physician has recommended that you wear a 14 day heart monitor (ZIO patch)- for tachycardia - This will be mailed directly to your home address - You may receive a call directly from the company for [place], which is [employer] within the next few day- if you see an 800# or 224# calling, please answer - Once the monitor is applied and activated it will record your heart rate/ rhythm for the duration that you are wearing it - If you are having any symptoms (dizziness/ lightheadedness/ palpitations/ racing heart/ anything that just doesn't feel right) then you will push the button in the center of the monitor to mark you were having a symptom - Please DO NOT shower for 24 hours after the monitor is placed - No tub baths/ swimming pools/ hot tubs while wearing the monitor - Do not put any lotions, oils, or ointments around the monitor - If you have any issues with the monitor itself, then please call the 800 # for the company - After 14  days please take the monitor off at home and mail it back ([place] mail) in the box provided by [employer], postage is already paid.   Follow-Up: At [facility], you and your health needs are our priority.  As part of our continuing mission to provide you with exceptional heart care, we have created designated Provider Care Teams.  These Care Teams include your primary Cardiologist (physician) and Advanced Practice Providers (APPs -  Physician Assistants and Nurse Practitioners) who all work together to provide you with the care you need, when you need it. . In 5-6 weeks with Dr. Fletcher Anon (e-visit)  Any Other Special Instructions Will Be Listed Below (If Applicable). - N/A

## 2018-10-10 NOTE — Progress Notes (Signed)
Virtual Visit via Video Note   This visit type was conducted due to national recommendations for restrictions regarding the COVID-19 Pandemic (e.g. social distancing) in an effort to limit this patient's exposure and mitigate transmission in our community.  Due to her co-morbid illnesses, this patient is at least at moderate risk for complications without adequate follow up.  This format is felt to be most appropriate for this patient at this time.  All issues noted in this document were discussed and addressed.  A limited physical exam was performed with this format.  Please refer to the patient's chart for her consent to telehealth for [facility]. Evaluation Performed:  Follow-up visit  This visit type was conducted due to national recommendations for restrictions regarding the COVID-19 Pandemic (e.g. social distancing).  This format is felt to be most appropriate for this patient at this time.  All issues noted in this document were discussed and addressed.  No physical exam was performed (except for noted visual exam findings with Video Visits).  Please refer to the patient's chart (MyChart message for video visits and phone note for telephone visits) for the patient's consent to telehealth for [facility]. _____________   Date:  10/10/2018   Patient ID:  KARIME SCHEUERMANN, DOB Jan 06, 1976, MRN [id number] Patient Location:  Home Provider location:   Office  Primary Care Provider:  [name], MD Primary Cardiologist:  Kathlyn Sacramento, MD  Chief Complaint    43 y/o ? w/ a ho MVP and PSVT, who presents for f/u related to palpitations, tachycardia, and fatigue.  Past Medical History    Past Medical History:  Diagnosis Date  . Allergy   . Arthritis   . Asthma   . Chest pain    a. 07/2017 St Echo: EF 65-70%, no rwma. No wall motion abnormalities w/ exercise. Nl study.  . Chicken pox   . Congenital pectus excavatum   . Depression   . Dyspnea on exertion   . Mitral valve  prolapse    a. 10/2016 Echo: EF 55-60%, no rwma, mild MVP w/ triv MR, nl RV fxn.  Marland Kitchen PSVT (paroxysmal supraventricular tachycardia) ([place])    a. Prev eval @ [facility] - wishes to avoid RFCA @ this time.   Past Surgical History:  Procedure Laterality Date  . FRACTURE SURGERY Right 08/1998   shoulder fracture  . SHOULDER SURGERY Right 08/1998, 02/1999, 09/2013    Allergies  No Known Allergies  History of Present Illness    LEILI ESKENAZI is a 43 y.o. female who presents via audio/video conferencing for a telehealth visit today.  As above, she has a h/o congenital pectus excavatum, MVP, and PSVT.  She was prev followed @ [facility] for PSVT and ablation was considered, however episodes became less frequent and shorter in duration, and so she has been managing symptoms with vagal maneuvers.  Echo in 10/2016 showed nl EF w/ mild MVP.  In 02/2017, she was seen in the ED w/ chest discomfort.  W/u was unremarkable and she was d/c'd. She was seen in 03/2017 and reported DOE, which has been stable since her teens.  A stress echo was ordered and subsequently carried out in 07/2017, which was normal.    She was last seen in clinic in April 2019, at which time symptoms were stable.  Overall, she has remained stable over the past year.  She experiences brief runs of SVT, typically less than 10 seconds, on a daily basis.  Only rarely does  she have to use vagal maneuvers for these to break.  At least once a week, she notes elevated heart rates at rest, sometimes running in the 120s to 130s for prolonged periods.  These episodes are different from typical SVT episodes.  She has never tried vagal maneuvers to try and break 1 of these episodes.  When she has periods of prolonged elevated heart rates, she is much more likely to feel fatigued during that day, and also notes dyspnea on exertion.  She denies chest pain, PND, orthopnea, dizziness, syncope, edema, or early satiety.  She believes that she had similar episodes of elevated  heart rates dating back to her 45s but cannot remember what her previous work-up at [facility] revealed.  The patient does not have symptoms concerning for COVID-19 infection (fever, chills, cough, or new shortness of breath).   Home Medications    Prior to Admission medications   Medication Sig Start Date End Date Taking? Authorizing Provider  ALPRAZolam (XANAX) 0.25 MG tablet Take 1 tablet (0.25 mg total) by mouth 2 (two) times daily as needed for anxiety. 09/27/17   [name], MD  celecoxib (CELEBREX) 200 MG capsule Take 1 capsule (200 mg total) by mouth daily. 09/29/17   [name], MD  Cholecalciferol (D 1000) 1000 units capsule Take 1,000 Units by mouth daily.    [provider]  diltiazem (CARDIZEM) 30 MG tablet Take 1 tablet (30 mg total) by mouth 4 (four) times daily. As needed for PSVT 09/27/17   [name], MD  FLUoxetine (PROZAC) 40 MG capsule Take 1 capsule (40 mg total) by mouth daily. 02/20/18   [name], MD  fluticasone (FLONASE) 50 MCG/ACT nasal spray Place 2 sprays into both nostrils daily. 07/16/15   [provider]  levonorgestrel (MIRENA) 20 MCG/24HR IUD by Intrauterine route.  04/16/13   [provider]  Turmeric (RA TURMERIC) 500 MG CAPS Take 1,000 mg by mouth daily.    [provider]    Review of Systems    Palpitations, tachycardia, and intermittent fatigue as outlined above.  All other systems reviewed and are otherwise negative except as noted above.  Physical Exam    Vital Signs:  BP 110/85 (BP Location: Left Arm, Patient Position: Sitting, Cuff Size: Normal)   Pulse 76   Ht 5' 6.5" (1.689 m)   Wt 133 lb (60.3 kg)   BMI 21.15 kg/m    No acute distress.  AAOx3. Pleasant. Resp reg and unlabored  Accessory Clinical Findings    Lab Results  Component Value Date   CHOL 171 09/27/2017   HDL 50.70 09/27/2017   LDLCALC 106 (H) 09/27/2017   TRIG 71.0 09/27/2017   CHOLHDL 3 09/27/2017    Lab Results   Component Value Date   CREATININE 0.83 09/27/2017   BUN 14 09/27/2017   NA 141 09/27/2017   K 4.1 09/27/2017   CL 102 09/27/2017   CO2 32 09/27/2017   Lab Results  Component Value Date   ALT 7 09/27/2017   AST 11 09/27/2017   ALKPHOS 56 09/27/2017   BILITOT 1.2 09/27/2017   Lab Results  Component Value Date   WBC 6.6 09/27/2017   HGB 15.4 (H) 09/27/2017   HCT 44.0 09/27/2017   MCV 90.3 09/27/2017   PLT 223.0 09/27/2017     Assessment & Plan    1.  PSVT: Overall, SVT symptoms have been stable over the past year.  These typically occur on a daily  basis, lasting under 10 seconds, and resolving spontaneously.  Only rarely over the past year, has she had to engage vagal maneuvers in order to get SVT to break.  She had previously not tolerated beta-blocker or calcium channel blocker therapy secondary to chronically low blood pressures and development of orthostasis on therapy.  She had been evaluated by electrophysiology at [facility] in the past and was told that due to the type of SVT she was experiencing, that it was not felt that ablation would be appropriate.    2.  Tachycardia and fatigue: Patient notes that at least once a week, she experiences elevated heart rates into the 120s or 130s, typically at rest, associated with fatigue, and potentially dyspnea when she exerts herself.  She notes that this tachycardia is different from her SVT symptoms as the rate is much lower and if she were not feeling fatigued and had not checked her heart rate, she would not necessarily even know that she was tachycardic.  She has not had any presyncope or syncope.  I am going to arrange for a 2-week ZIO monitor to formally document her rhythm during these periods of tachycardia.  I will also follow-up lab work as it has been over a year since routine labs.  Pending zio results, will consider repeat echo.  3.  Disposition: Follow-up CBC, complete metabolic panel, lipids, and TSH.  Arrange for 2-week ZIO XT.   Follow-up video visit in approximately 1 month or sooner if necessary.  COVID-19 Education: The signs and symptoms of COVID-19 were discussed with the patient and how to seek care for testing (follow up with PCP or arrange E-visit).  The importance of social distancing was discussed today.  Patient Risk:   After full review of this patient's history and clinical status, I feel that he is at least moderate risk for cardiac complications at this time, thus necessitating a telehealth visit sooner than our first available in office visit.  Time:   Today, I have spent 18 minutes with the patient with telehealth technology discussing medical history, symptoms, and management plan.     Murray Hodgkins, NP 10/10/2018, 10:18 AM

## 2018-10-14 ENCOUNTER — Ambulatory Visit (INDEPENDENT_AMBULATORY_CARE_PROVIDER_SITE_OTHER): Payer: 59

## 2018-10-14 DIAGNOSIS — I471 Supraventricular tachycardia: Secondary | ICD-10-CM | POA: Diagnosis not present

## 2018-10-24 ENCOUNTER — Other Ambulatory Visit
Admission: RE | Admit: 2018-10-24 | Discharge: 2018-10-24 | Disposition: A | Discharge status: Home / Self Care | Payer: 59 | Source: Ambulatory Visit | Attending: Nurse Practitioner | Admitting: Nurse Practitioner

## 2018-10-24 ENCOUNTER — Other Ambulatory Visit: Payer: Self-pay

## 2018-10-24 DIAGNOSIS — R5383 Other fatigue: Secondary | ICD-10-CM | POA: Insufficient documentation

## 2018-10-24 DIAGNOSIS — I471 Supraventricular tachycardia: Secondary | ICD-10-CM | POA: Diagnosis not present

## 2018-10-24 DIAGNOSIS — Z0000 Encounter for general adult medical examination without abnormal findings: Secondary | ICD-10-CM | POA: Diagnosis not present

## 2018-10-24 LAB — COMPREHENSIVE METABOLIC PANEL
ALT: 11 U/L (ref 0–44)
AST: 14 U/L — ABNORMAL LOW (ref 15–41)
Albumin: 4.4 g/dL (ref 3.5–5.0)
Alkaline Phosphatase: 52 U/L (ref 38–126)
Anion gap: 11 (ref 5–15)
BUN: 17 mg/dL (ref 6–20)
CO2: 25 mmol/L (ref 22–32)
Calcium: 9.1 mg/dL (ref 8.9–10.3)
Chloride: 103 mmol/L (ref 98–111)
Creatinine, Ser: 0.81 mg/dL (ref 0.44–1.00)
GFR calc Af Amer: >60 mL/min (ref >60)
GFR calc non Af Amer: >60 mL/min (ref >60)
Glucose, Bld: 98 mg/dL (ref 70–99)
Potassium: 4.8 mmol/L (ref 3.5–5.1)
Sodium: 139 mmol/L (ref 135–145)
Total Bilirubin: 1.6 mg/dL — ABNORMAL HIGH (ref 0.3–1.2)
Total Protein: 7.4 g/dL (ref 6.5–8.1)

## 2018-10-24 LAB — CBC WITH DIFFERENTIAL/PLATELET
Abs Immature Granulocytes: 0.01 10*3/uL (ref 0.00–0.07)
Basophils Absolute: 0.1 10*3/uL (ref 0.0–0.1)
Basophils Relative: 1 %
Eosinophils Absolute: 0.2 10*3/uL (ref 0.0–0.5)
Eosinophils Relative: 3 %
HCT: 44.6 % (ref 36.0–46.0)
Hemoglobin: 15.8 g/dL — ABNORMAL HIGH (ref 12.0–15.0)
Immature Granulocytes: 0 %
Lymphocytes Relative: 26 %
Lymphs Abs: 1.5 10*3/uL (ref 0.7–4.0)
MCH: 30.7 pg (ref 26.0–34.0)
MCHC: 35.4 g/dL (ref 30.0–36.0)
MCV: 86.6 fL (ref 80.0–100.0)
Monocytes Absolute: 0.4 10*3/uL (ref 0.1–1.0)
Monocytes Relative: 7 %
Neutro Abs: 3.5 10*3/uL (ref 1.7–7.7)
Neutrophils Relative %: 63 %
Platelets: 168 10*3/uL (ref 150–400)
RBC: 5.15 MIL/uL — ABNORMAL HIGH (ref 3.87–5.11)
RDW: 11.7 % (ref 11.5–15.5)
WBC: 5.6 10*3/uL (ref 4.0–10.5)
nRBC: 0 % (ref 0.0–0.2)

## 2018-10-24 LAB — LIPID PANEL
Cholesterol: 182 mg/dL (ref 0–200)
HDL: 52 mg/dL (ref >40)
LDL Cholesterol: 114 mg/dL — ABNORMAL HIGH (ref 0–99)
Total CHOL/HDL Ratio: 3.5 RATIO
Triglycerides: 80 mg/dL (ref <150)
VLDL: 16 mg/dL (ref 0–40)

## 2018-10-24 LAB — TSH: TSH: 1.541 u[IU]/mL (ref 0.350–4.500)

## 2018-11-05 DIAGNOSIS — I479 Paroxysmal tachycardia, unspecified: Secondary | ICD-10-CM | POA: Diagnosis not present

## 2018-11-06 ENCOUNTER — Other Ambulatory Visit: Payer: Self-pay

## 2018-11-22 ENCOUNTER — Telehealth: Payer: Self-pay | Admitting: Cardiovascular Disease

## 2018-11-22 ENCOUNTER — Encounter: Payer: Self-pay | Admitting: Cardiovascular Disease

## 2018-11-22 ENCOUNTER — Ambulatory Visit (INDEPENDENT_AMBULATORY_CARE_PROVIDER_SITE_OTHER): Payer: 59 | Admitting: Cardiovascular Disease

## 2018-11-22 ENCOUNTER — Other Ambulatory Visit: Payer: Self-pay

## 2018-11-22 VITALS — BP 92/62 | HR 63 | Ht 66.5 in | Wt 132.8 lb

## 2018-11-22 DIAGNOSIS — I341 Nonrheumatic mitral (valve) prolapse: Secondary | ICD-10-CM | POA: Diagnosis not present

## 2018-11-22 DIAGNOSIS — Z8679 Personal history of other diseases of the circulatory system: Secondary | ICD-10-CM | POA: Diagnosis not present

## 2018-11-22 MED ORDER — METOPROLOL TARTRATE 25 MG PO TABS: 25.0000 mg | ORAL_TABLET | Freq: Two times a day (BID) | ORAL | 3 refills | Status: DC | PRN

## 2018-11-22 NOTE — Patient Instructions (Signed)
Medication Instructions:  Your physician has recommended you make the following change in your medication:  1) STOP Diltiazem 2) START Metoprolol 25mg  twice daily as needed for tachycardia  If you need a refill on your cardiac medications before your next appointment, please call your pharmacy.   Lab work: None ordered If you have labs (blood work) drawn today and your tests are completely normal, you will receive your results only by: Marland Kitchen MyChart Message (if you have MyChart) OR . A paper copy in the mail If you have any lab test that is abnormal or we need to change your treatment, we will call you to review the results.  Testing/Procedures: None ordered  Follow-Up: At [facility], you and your health needs are our priority.  As part of our continuing mission to provide you with exceptional heart care, we have created designated Provider Care Teams.  These Care Teams include your primary Cardiologist (physician) and Advanced Practice Providers (APPs -  Physician Assistants and Nurse Practitioners) who all work together to provide you with the care you need, when you need it. You will need a follow up appointment in 12 months.  Please call our office 2 months in advance to schedule this appointment.  You may see Kathlyn Sacramento, MD or one of the following Advanced Practice Providers on your designated Care Team:   Murray Hodgkins, NP Christell Faith, PA-C . Marrianne Mood, PA-C

## 2018-11-22 NOTE — Progress Notes (Signed)
Cardiology Office Note   Date:  11/22/2018   ID:  Sarah Pratt, DOB [birth year], MRN [id number]  PCP:  [name], MD  Cardiologist:   Kathlyn Sacramento, MD   Chief Complaint  Patient presents with  . other    5-6 week follow up. Meds reviewed by the pt. verbally. "doing well."       History of Present Illness: Sarah Pratt is a 43 y.o. female who is here today for follow-up visit regarding paroxysmal supraventricular tachycardia and mitral valve prolapse. She has known history of asthma, congenital pectus excavatum and depression.  She has prolonged history of paroxysmal supraventricular tachycardia.  Ablation was considered in the past at [facility] but her episodes became less frequent and thus she has been monitored clinically.   Most recent echocardiogram in June 2018 showed normal LV systolic function with mild mitral valve prolapse with trivial regurgitation. She had stress echocardiogram in March 2019 which showed no evidence of ischemia.  She works at [employer].  She had a telemedicine visit with Ignacia Bayley in June for increased palpitations and tachycardia.  She underwent a 2-week ZIO patch monitor which showed normal sinus rhythm with an average heart rate of 75 bpm.  She had rare PACs and PVCs with intermittent sinus tachycardia with a maximum heart rate of 159 bpm.  No evidence of supraventricular tachycardia.  She has an apple watch and monitors her heart rate.  She complains of palpitations especially when she is in the standing position for a long time.  Her heart rate is usually around 130 bpm even when she is not exerting herself.  She tries to walk for exercise.  When her heart rate is fast, she feels more out of breath.  She does consume good amount of sodium and tries to stay well-hydrated.  Past Medical History:  Diagnosis Date  . Allergy   . Arthritis   . Asthma   . Chest pain    a. 07/2017 St Echo: EF 65-70%, no rwma. No wall motion  abnormalities w/ exercise. Nl study.  . Chicken pox   . Congenital pectus excavatum   . Depression   . Dyspnea on exertion   . Mitral valve prolapse    a. 10/2016 Echo: EF 55-60%, no rwma, mild MVP w/ triv MR, nl RV fxn.  Marland Kitchen PSVT (paroxysmal supraventricular tachycardia) ([place])    a. Prev eval @ [facility] - wishes to avoid RFCA @ this time.    Past Surgical History:  Procedure Laterality Date  . FRACTURE SURGERY Right 08/1998   shoulder fracture  . SHOULDER SURGERY Right 08/1998, 02/1999, 09/2013     Current Outpatient Medications  Medication Sig Dispense Refill  . celecoxib (CELEBREX) 200 MG capsule Take 1 capsule (200 mg total) by mouth daily. 90 capsule 1  . Cholecalciferol (D 1000) 1000 units capsule Take 1,000 Units by mouth daily.    Marland Kitchen diltiazem (CARDIZEM) 30 MG tablet Take 1 tablet (30 mg total) by mouth 4 (four) times daily. As needed for PSVT 30 tablet 1  . FLUoxetine (PROZAC) 40 MG capsule Take 1 capsule (40 mg total) by mouth daily. 90 capsule 1  . fluticasone (FLONASE) 50 MCG/ACT nasal spray Place 2 sprays into both nostrils daily.    Marland Kitchen levonorgestrel (MIRENA) 20 MCG/24HR IUD by Intrauterine route.     . Turmeric (RA TURMERIC) 500 MG CAPS Take 1,000 mg by mouth daily.     No current facility-administered medications for this  visit.     Allergies:   Patient has no known allergies.    Social History:  The patient  reports that she has never smoked. She has never used smokeless tobacco. She reports that she does not drink alcohol or use drugs.   Family History:  The patient's family history includes Arthritis in her father; Atrial fibrillation in her mother; Breast cancer in her maternal aunt; Colon cancer in her maternal grandmother; Diabetes (age of onset: 66) in her mother; Hyperlipidemia in her father and mother; Multiple sclerosis (age of onset: 17) in her sister; Parkinson's disease (age of onset: 63) in her father; Rheum arthritis (age of onset: 17) in her sister.    ROS:   Please see the history of present illness.   Otherwise, review of systems are positive for none.   All other systems are reviewed and negative.    PHYSICAL EXAM: VS:  BP 92/62 (BP Location: Left Arm, Patient Position: Sitting, Cuff Size: Normal)   Pulse 63   Ht 5' 6.5" (1.689 m)   Wt 132 lb 12 oz (60.2 kg)   BMI 21.11 kg/m  , BMI Body mass index is 21.11 kg/m. GEN: Well nourished, well developed, in no acute distress  HEENT: normal  Neck: no JVD, carotid bruits, or masses Cardiac: RRR; no murmurs, rubs, or gallops,no edema  Respiratory:  clear to auscultation bilaterally, normal work of breathing GI: soft, nontender, nondistended, + BS MS: no deformity or atrophy  Skin: warm and dry, no rash Neuro:  Strength and sensation are intact Psych: euthymic mood, full affect   EKG:  EKG is ordered today. EKG showed sinus rhythm with short PR interval.  Normal QT interval.  No significant ST or T wave changes.   Recent Labs: 10/24/2018: ALT 11; BUN 17; Creatinine, Ser 0.81; Hemoglobin 15.8; Platelets 168; Potassium 4.8; Sodium 139; TSH 1.541    Lipid Panel    Component Value Date/Time   CHOL 182 10/24/2018 0945   TRIG 80 10/24/2018 0945   HDL 52 10/24/2018 0945   CHOLHDL 3.5 10/24/2018 0945   VLDL 16 10/24/2018 0945   LDLCALC 114 (H) 10/24/2018 0945      Wt Readings from Last 3 Encounters:  11/22/18 132 lb 12 oz (60.2 kg)  10/10/18 133 lb (60.3 kg)  12/04/17 142 lb (64.4 kg)        PAD Screen 10/31/2016  Previous PAD dx? No  Previous surgical procedure? No  Pain with walking? No  Feet/toe relief with dangling? No  Painful, non-healing ulcers? No  Extremities discolored? No      ASSESSMENT AND PLAN:  1.  Paroxysmal supraventricular tachycardia: Episodes are overall short in duration and respond to vagal maneuvers.    2. Mitral valve prolapse: No cardiac murmurs by exam.  Echocardiogram in 2018 showed only trivial regurgitation.  3.  Sinus tachycardia: Some of  her symptoms are suggestive of POTS: Discussed the importance of increased fluid intake and avoiding sudden change in position.  I am going to give her metoprolol to be used as needed.  It is hard to use this continuously given relatively low blood pressure at baseline.   Disposition:   FU with me in 1 year  Signed,  Kathlyn Sacramento, MD  11/22/2018 2:02 PM    [place]

## 2018-11-22 NOTE — Telephone Encounter (Signed)

## 2019-01-04 DIAGNOSIS — D2262 Melanocytic nevi of left upper limb, including shoulder: Secondary | ICD-10-CM | POA: Diagnosis not present

## 2019-01-04 DIAGNOSIS — D2261 Melanocytic nevi of right upper limb, including shoulder: Secondary | ICD-10-CM | POA: Diagnosis not present

## 2019-01-04 DIAGNOSIS — D225 Melanocytic nevi of trunk: Secondary | ICD-10-CM | POA: Diagnosis not present

## 2019-01-04 DIAGNOSIS — D2271 Melanocytic nevi of right lower limb, including hip: Secondary | ICD-10-CM | POA: Diagnosis not present

## 2019-01-04 DIAGNOSIS — D2272 Melanocytic nevi of left lower limb, including hip: Secondary | ICD-10-CM | POA: Diagnosis not present

## 2019-09-05 ENCOUNTER — Other Ambulatory Visit: Payer: Self-pay

## 2019-09-05 ENCOUNTER — Ambulatory Visit (INDEPENDENT_AMBULATORY_CARE_PROVIDER_SITE_OTHER): Payer: 59

## 2019-09-05 ENCOUNTER — Ambulatory Visit (INDEPENDENT_AMBULATORY_CARE_PROVIDER_SITE_OTHER): Payer: 59 | Admitting: Internal Medicine

## 2019-09-05 ENCOUNTER — Encounter: Payer: Self-pay | Admitting: Internal Medicine

## 2019-09-05 ENCOUNTER — Other Ambulatory Visit (HOSPITAL_COMMUNITY)
Admission: RE | Admit: 2019-09-05 | Discharge: 2019-09-05 | Disposition: A | Discharge status: Home / Self Care | Payer: 59 | Source: Ambulatory Visit | Attending: Internal Medicine | Admitting: Internal Medicine

## 2019-09-05 VITALS — BP 100/68 | HR 70 | Temp 97.7°F | Resp 15 | Ht 66.5 in | Wt 125.0 lb

## 2019-09-05 DIAGNOSIS — G8929 Other chronic pain: Secondary | ICD-10-CM | POA: Diagnosis not present

## 2019-09-05 DIAGNOSIS — Z86018 Personal history of other benign neoplasm: Secondary | ICD-10-CM

## 2019-09-05 DIAGNOSIS — M25552 Pain in left hip: Secondary | ICD-10-CM | POA: Diagnosis not present

## 2019-09-05 DIAGNOSIS — Z0000 Encounter for general adult medical examination without abnormal findings: Secondary | ICD-10-CM

## 2019-09-05 DIAGNOSIS — F330 Major depressive disorder, recurrent, mild: Secondary | ICD-10-CM | POA: Diagnosis not present

## 2019-09-05 DIAGNOSIS — Q676 Pectus excavatum: Secondary | ICD-10-CM

## 2019-09-05 DIAGNOSIS — Z124 Encounter for screening for malignant neoplasm of cervix: Secondary | ICD-10-CM | POA: Diagnosis not present

## 2019-09-05 MED ORDER — FLUOXETINE HCL 10 MG PO CAPS: 10.0000 mg | ORAL_CAPSULE | Freq: Every day | ORAL | 0 refills | Status: DC

## 2019-09-05 NOTE — Progress Notes (Signed)
Patient ID: Sarah Pratt, female    DOB: November 19, 1975  Age: 44 y.o. MRN: [id number]  The patient is here for annual female comprehensive  examination and management of other chronic and acute problems.   The risk factors are reflected in the social history.  The roster of all physicians providing medical care to patient - is listed in the Snapshot section of the chart.  Activities of daily living:  The patient is 100% independent in all ADLs: dressing, toileting, feeding as well as independent mobility  Home safety : The patient has smoke detectors in the home. They wear seatbelts.  There are no firearms at home. There is no violence in the home.   There is no risks for hepatitis, STDs or HIV. There is no   history of blood transfusion. They have no travel history to infectious disease endemic areas of the world.  The patient has seen their dentist in the last six month. They have seen their eye doctor in the last year.   They do not  have excessive sun exposure. Discussed the need for sun protection: hats, long sleeves and use of sunscreen if there is significant sun exposure.   Diet: the importance of a healthy diet is discussed. They do have a healthy diet.  The benefits of regular aerobic exercise were discussed. She walks 4 times per week ,  45 minutes.   Depression screen: there are no signs or vegative symptoms of depression- irritability, change in appetite, anhedonia, sadness/tearfullness.  The following portions of the patient's history were reviewed and updated as appropriate: allergies, current medications, past family history, past medical history,  past surgical history, past social history  and problem list.  Visual acuity was not assessed per patient preference since she has regular follow up with her ophthalmologist. Hearing and body mass index were assessed and reviewed.   During the course of the visit the patient was educated and counseled about appropriate screening  and preventive services including : fall prevention , diabetes screening, nutrition counseling, colorectal cancer screening, and recommended immunizations.    CC: The primary encounter diagnosis was Cervical cancer screening. Diagnoses of Left hip pain, Congenital pectus excavatum, Encounter for preventive health examination, History of dysplastic nevus, Mild episode of recurrent major depressive disorder ([place]), and Chronic left hip pain were also pertinent to this visit.  1) early signs of depression:  Insomnia,  irritability trouble concentrating .  Stopped prozac about a year ago.  More teary, more sensitive to criticism per husband.   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   2) left hip pain  New onset .  No bruising,  No history of fall .  Aching,  Aggravated by prolonged bending,  Walks lasting  > 60 minutes   3) Right shoulder DJD.  putting off right shoulder replacement . Pain is not severe,  Ortho rec placement.   4) pectus excavatum right side  History Nakari has a past medical history of Allergy, Arthritis, Asthma, Chest pain, Chicken pox, Congenital pectus excavatum, Depression, Dyspnea on exertion, Mitral valve prolapse, and PSVT (paroxysmal supraventricular tachycardia) ([place]).   She has a past surgical history that includes Fracture surgery (Right, 08/1998) and Shoulder surgery (Right, 08/1998, 02/1999, 09/2013).   Her family history includes  Arthritis in her father; Atrial fibrillation in her mother; Breast cancer in her maternal aunt; Colon cancer in her maternal grandmother; Diabetes (age of onset: 23) in her mother; Hyperlipidemia in her father and mother; Multiple sclerosis (age of onset: 37) in her sister; Parkinson's disease (age of onset: 87) in her father; Rheum arthritis (age of onset: 85) in her sister; Von  Willebrand disease in her son.She reports that she has never smoked. She has never used smokeless tobacco. She reports that she does not drink alcohol or use drugs.  Outpatient Medications Prior to Visit  Medication Sig Dispense Refill  . Cholecalciferol (D 1000) 1000 units capsule Take 1,000 Units by mouth daily.    . fluticasone (FLONASE) 50 MCG/ACT nasal spray Place 2 sprays into both nostrils daily.    Marland Kitchen levonorgestrel (MIRENA) 20 MCG/24HR IUD by Intrauterine route.     . metoprolol tartrate (LOPRESSOR) 25 MG tablet Take 1 tablet (25 mg total) by mouth 2 (two) times daily as needed (for Tachycardia). 180 tablet 3  . celecoxib (CELEBREX) 200 MG capsule Take 1 capsule (200 mg total) by mouth daily. (Patient not taking: Reported on 09/05/2019) 90 capsule 1  . FLUoxetine (PROZAC) 40 MG capsule Take 1 capsule (40 mg total) by mouth daily. (Patient not taking: Reported on 09/05/2019) 90 capsule 1  . Turmeric (RA TURMERIC) 500 MG CAPS Take 1,000 mg by mouth daily.     No facility-administered medications prior to visit.    Review of Systems   Patient denies headache, fevers, malaise, unintentional weight loss, skin rash, eye pain, sinus congestion and sinus pain, sore throat, dysphagia,  hemoptysis , cough, dyspnea, wheezing, chest pain, palpitations, orthopnea, edema, abdominal pain, nausea, melena, diarrhea, constipation, flank pain, dysuria, hematuria, urinary  Frequency, nocturia, numbness, tingling, seizures,  Focal weakness, Loss of consciousness,  Tremor, insomnia, depression, anxiety, and suicidal ideation.      Objective:  BP 100/68 (BP Location: Left Arm, Patient Position: Sitting, Cuff Size: Normal)   Pulse 70   Temp 97.7 F (36.5 C) (Temporal)   Resp 15   Ht 5' 6.5" (1.689 m)   Wt 125 lb (56.7 kg)   SpO2 99%   BMI 19.87 kg/m   Physical Exam  General Appearance:    Alert, cooperative, no distress, appears stated age  Head:    Normocephalic, without obvious abnormality,  atraumatic  Eyes:    PERRL, conjunctiva/corneas clear, EOM's intact, fundi    benign, both eyes  Ears:    Normal TM's and external ear canals, both ears  Nose:   Nares normal, septum midline, mucosa normal, no drainage    or sinus tenderness  Throat:   Lips, mucosa, and tongue normal; teeth and gums normal  Neck:   Supple, symmetrical, trachea midline, no adenopathy;    thyroid:  no enlargement/tenderness/nodules; no carotid   bruit or JVD  Back:     Symmetric, no curvature, ROM normal, no CVA tenderness  Lungs:     Clear to auscultation bilaterally, respirations unlabored  Chest Wall:    No tenderness or deformity   Heart:    Regular rate and rhythm, S1 and S2 normal, no murmur, rub   or gallop  Breast Exam:    Right nipple inverted, chronic. Pectus excavatum localized to right side.. No tenderness, masses, or nipple abnormality  Abdomen:     Soft, non-tender, bowel sounds active all four quadrants,    no masses, no organomegaly  Genitalia:    Pelvic: cervix normal in  appearance, external genitalia normal, no adnexal masses or tenderness, no cervical motion tenderness, rectovaginal septum normal, uterus normal size, shape, and consistency and vagina normal without discharge  Extremities:   Extremities normal, atraumatic, no cyanosis or edema  Pulses:   2+ and symmetric all extremities  Skin:   Skin color, texture, turgor normal, no rashes or lesions  Lymph nodes:   Cervical, supraclavicular, and axillary nodes normal  Neurologic:   CNII-XII intact, normal strength, sensation and reflexes    throughout     Assessment & Plan:   Problem List Items Addressed This Visit      Unprioritized   Chronic left hip pain    Plain films normal.  Likely bursitis from prolonged misuse during furniture remodelling       Relevant Medications   FLUoxetine (PROZAC) 10 MG capsule   Congenital pectus excavatum    Affecting right side       Depression    Resuming prozac , starting at 10 mg daily  previous treatment dose was 40 mg. Advised toincrease dose to 20 mg in  2 weeks and follow up with me in 4 weeks       Relevant Medications   FLUoxetine (PROZAC) 10 MG capsule   Encounter for preventive health examination    age appropriate education and counseling updated, referrals for preventative services and immunizations addressed, dietary and smoking counseling addressed, most recent labs reviewed.  I have personally reviewed and have noted:  1) the patient's medical and social history 2) The pt's use of alcohol, tobacco, and illicit drugs 3) The patient's current medications and supplements 4) Functional ability including ADL's, fall risk, home safety risk, hearing and visual impairment 5) Diet and physical activities 6) Evidence for depression or mood disorder 7) The patient's height, weight, and BMI have been recorded in the chart  I have made referrals, and provided counseling and education based on review of the above      History of dysplastic nevus    cotinue seeing Dr Loreli Dollar for annual skin check given history of  Dysplastic nevus.        Other Visit Diagnoses    Cervical cancer screening    -  Primary   Relevant Orders   Cytology - PAP( [place]) (Completed)   Left hip pain       Relevant Orders   DG Hip Unilat W OR W/O Pelvis 2-3 Views Left (Completed)      I have discontinued Elvin S. Dona's Turmeric, celecoxib, and FLUoxetine. I am also having her start on FLUoxetine. Additionally, I am having her maintain her levonorgestrel, fluticasone, Cholecalciferol, and metoprolol tartrate.  Meds ordered this encounter  Medications  . FLUoxetine (PROZAC) 10 MG capsule    Sig: Take 1 capsule (10 mg total) by mouth daily.    Dispense:  90 capsule    Refill:  0    Medications Discontinued During This Encounter  Medication Reason  . Turmeric (RA TURMERIC) 500 MG CAPS Patient has not taken in last 30 days  . celecoxib (CELEBREX) 200 MG capsule Patient  has not taken in last 30 days  . FLUoxetine (PROZAC) 40 MG capsule     Follow-up: Return in about 4 weeks (around 10/03/2019).   [name], MD

## 2019-09-05 NOTE — Patient Instructions (Addendum)
Return one month for repeat pelvic exam  Make sure you are well hydrated ( and not full of stool)  We'll repeat your CBC then   Prozac restart:  10 mg with food  To start,  Drop dose to 1/2 tablet if nauseated,  Increase dose after 2 weeks if needed after   Health Maintenance, Female Adopting a healthy lifestyle and getting preventive care are important in promoting health and wellness. Ask your health care provider about:  The right schedule for you to have regular tests and exams.  Things you can do on your own to prevent diseases and keep yourself healthy. What should I know about diet, weight, and exercise? Eat a healthy diet   Eat a diet that includes plenty of vegetables, fruits, low-fat dairy products, and lean protein.  Do not eat a lot of foods that are high in solid fats, added sugars, or sodium. Maintain a healthy weight Body mass index (BMI) is used to identify weight problems. It estimates body fat based on height and weight. Your health care provider can help determine your BMI and help you achieve or maintain a healthy weight. Get regular exercise Get regular exercise. This is one of the most important things you can do for your health. Most adults should:  Exercise for at least 150 minutes each week. The exercise should increase your heart rate and make you sweat (moderate-intensity exercise).  Do strengthening exercises at least twice a week. This is in addition to the moderate-intensity exercise.  Spend less time sitting. Even light physical activity can be beneficial. Watch cholesterol and blood lipids Have your blood tested for lipids and cholesterol at 46 years of age, then have this test every 5 years. Have your cholesterol levels checked more often if:  Your lipid or cholesterol levels are high.  You are older than 46 years of age.  You are at high risk for heart disease. What should I know about cancer screening? Depending on your health history and  family history, you may need to have cancer screening at various ages. This may include screening for:  Breast cancer.  Cervical cancer.  Colorectal cancer.  Skin cancer.  Lung cancer. What should I know about heart disease, diabetes, and high blood pressure? Blood pressure and heart disease  High blood pressure causes heart disease and increases the risk of stroke. This is more likely to develop in people who have high blood pressure readings, are of African descent, or are overweight.  Have your blood pressure checked: ? Every 3-5 years if you are 7-3 years of age. ? Every year if you are 62 years old or older. Diabetes Have regular diabetes screenings. This checks your fasting blood sugar level. Have the screening done:  Once every three years after age 32 if you are at a normal weight and have a low risk for diabetes.  More often and at a younger age if you are overweight or have a high risk for diabetes. What should I know about preventing infection? Hepatitis B If you have a higher risk for hepatitis B, you should be screened for this virus. Talk with your health care provider to find out if you are at risk for hepatitis B infection. Hepatitis C Testing is recommended for:  Everyone born from [birth year] through 1967.  Anyone with known risk factors for hepatitis C. Sexually transmitted infections (STIs)  Get screened for STIs, including gonorrhea and chlamydia, if: ? You are sexually active and are younger than  46 years of age. ? You are older than 46 years of age and your health care provider tells you that you are at risk for this type of infection. ? Your sexual activity has changed since you were last screened, and you are at increased risk for chlamydia or gonorrhea. Ask your health care provider if you are at risk.  Ask your health care provider about whether you are at high risk for HIV. Your health care provider may recommend a prescription medicine to help prevent  HIV infection. If you choose to take medicine to prevent HIV, you should first get tested for HIV. You should then be tested every 3 months for as long as you are taking the medicine. Pregnancy  If you are about to stop having your period (premenopausal) and you may become pregnant, seek counseling before you get pregnant.  Take 400 to 800 micrograms (mcg) of folic acid every day if you become pregnant.  Ask for birth control (contraception) if you want to prevent pregnancy. Osteoporosis and menopause Osteoporosis is a disease in which the bones lose minerals and strength with aging. This can result in bone fractures. If you are 45 years old or older, or if you are at risk for osteoporosis and fractures, ask your health care provider if you should:  Be screened for bone loss.  Take a calcium or vitamin D supplement to lower your risk of fractures.  Be given hormone replacement therapy (HRT) to treat symptoms of menopause. Follow these instructions at home: Lifestyle  Do not use any products that contain nicotine or tobacco, such as cigarettes, e-cigarettes, and chewing tobacco. If you need help quitting, ask your health care provider.  Do not use street drugs.  Do not share needles.  Ask your health care provider for help if you need support or information about quitting drugs. Alcohol use  Do not drink alcohol if: ? Your health care provider tells you not to drink. ? You are pregnant, may be pregnant, or are planning to become pregnant.  If you drink alcohol: ? Limit how much you use to 0-1 drink a day. ? Limit intake if you are breastfeeding.  Be aware of how much alcohol is in your drink. In the [place] one drink equals one 12 oz bottle of beer (355 mL), one 5 oz glass of wine (148 mL), or one 1 oz glass of hard liquor (44 mL). General instructions  Schedule regular health, dental, and eye exams.  Stay current with your vaccines.  Tell your health care provider if: ? You  often feel depressed. ? You have ever been abused or do not feel safe at home. Summary  Adopting a healthy lifestyle and getting preventive care are important in promoting health and wellness.  Follow your health care provider's instructions about healthy diet, exercising, and getting tested or screened for diseases.  Follow your health care provider's instructions on monitoring your cholesterol and blood pressure. This information is not intended to replace advice given to you by your health care provider. Make sure you discuss any questions you have with your health care provider. Document Revised: 04/18/2018 Document Reviewed: 04/18/2018 Elsevier Patient Education  2020 [employer].

## 2019-09-06 LAB — CYTOLOGY - PAP
Chlamydia: NEGATIVE
Comment: NEGATIVE
Comment: NEGATIVE
Comment: NEGATIVE
Comment: NORMAL
Diagnosis: NEGATIVE
High risk HPV: NEGATIVE
Neisseria Gonorrhea: NEGATIVE
Trichomonas: NEGATIVE

## 2019-09-07 DIAGNOSIS — F32A Depression, unspecified: Secondary | ICD-10-CM | POA: Insufficient documentation

## 2019-09-07 DIAGNOSIS — M25552 Pain in left hip: Secondary | ICD-10-CM | POA: Insufficient documentation

## 2019-09-07 DIAGNOSIS — G8929 Other chronic pain: Secondary | ICD-10-CM | POA: Insufficient documentation

## 2019-09-07 DIAGNOSIS — F329 Major depressive disorder, single episode, unspecified: Secondary | ICD-10-CM | POA: Insufficient documentation

## 2019-09-07 NOTE — Assessment & Plan Note (Signed)
Affecting right side

## 2019-09-07 NOTE — Assessment & Plan Note (Signed)
Plain films normal.  Likely bursitis from prolonged misuse during furniture remodelling

## 2019-09-07 NOTE — Assessment & Plan Note (Signed)
cotinue seeing Dr Loreli Dollar for annual skin check given history of  Dysplastic nevus.

## 2019-09-07 NOTE — Assessment & Plan Note (Signed)
Resuming prozac , starting at 10 mg daily previous treatment dose was 40 mg. Advised toincrease dose to 20 mg in  2 weeks and follow up with me in 4 weeks

## 2019-09-07 NOTE — Assessment & Plan Note (Signed)

## 2019-10-02 ENCOUNTER — Other Ambulatory Visit: Payer: Self-pay

## 2019-10-04 ENCOUNTER — Encounter: Payer: Self-pay | Admitting: Internal Medicine

## 2019-10-04 ENCOUNTER — Ambulatory Visit (INDEPENDENT_AMBULATORY_CARE_PROVIDER_SITE_OTHER): Payer: 59

## 2019-10-04 ENCOUNTER — Ambulatory Visit: Payer: 59 | Admitting: Internal Medicine

## 2019-10-04 ENCOUNTER — Other Ambulatory Visit: Payer: Self-pay

## 2019-10-04 VITALS — BP 98/62 | HR 64 | Temp 97.8°F | Resp 14 | Ht 66.5 in | Wt 122.2 lb

## 2019-10-04 DIAGNOSIS — N912 Amenorrhea, unspecified: Secondary | ICD-10-CM

## 2019-10-04 DIAGNOSIS — E782 Mixed hyperlipidemia: Secondary | ICD-10-CM

## 2019-10-04 DIAGNOSIS — Z780 Asymptomatic menopausal state: Secondary | ICD-10-CM

## 2019-10-04 DIAGNOSIS — F330 Major depressive disorder, recurrent, mild: Secondary | ICD-10-CM | POA: Diagnosis not present

## 2019-10-04 DIAGNOSIS — R634 Abnormal weight loss: Secondary | ICD-10-CM | POA: Diagnosis not present

## 2019-10-04 DIAGNOSIS — R5383 Other fatigue: Secondary | ICD-10-CM | POA: Diagnosis not present

## 2019-10-04 LAB — IBC + FERRITIN
Ferritin: 109.1 ng/mL (ref 10.0–291.0)
Iron: 102 ug/dL (ref 42–145)
Saturation Ratios: 33.1 % (ref 20.0–50.0)
Transferrin: 220 mg/dL (ref 212.0–360.0)

## 2019-10-04 LAB — COMPREHENSIVE METABOLIC PANEL
ALT: 10 U/L (ref 0–35)
AST: 13 U/L (ref 0–37)
Albumin: 4.6 g/dL (ref 3.5–5.2)
Alkaline Phosphatase: 56 U/L (ref 39–117)
BUN: 23 mg/dL (ref 6–23)
CO2: 30 mEq/L (ref 19–32)
Calcium: 9.2 mg/dL (ref 8.4–10.5)
Chloride: 105 mEq/L (ref 96–112)
Creatinine, Ser: 0.78 mg/dL (ref 0.40–1.20)
GFR: 79.56 mL/min (ref >60.00)
Glucose, Bld: 89 mg/dL (ref 70–99)
Potassium: 4.2 mEq/L (ref 3.5–5.1)
Sodium: 141 mEq/L (ref 135–145)
Total Bilirubin: 2 mg/dL — ABNORMAL HIGH (ref 0.2–1.2)
Total Protein: 6.9 g/dL (ref 6.0–8.3)

## 2019-10-04 LAB — CBC WITH DIFFERENTIAL/PLATELET
Basophils Absolute: 0 10*3/uL (ref 0.0–0.1)
Basophils Relative: 0.9 % (ref 0.0–3.0)
Eosinophils Absolute: 0.2 10*3/uL (ref 0.0–0.7)
Eosinophils Relative: 3.4 % (ref 0.0–5.0)
HCT: 42.9 % (ref 36.0–46.0)
Hemoglobin: 14.9 g/dL (ref 12.0–15.0)
Lymphocytes Relative: 24.4 % (ref 12.0–46.0)
Lymphs Abs: 1.2 10*3/uL (ref 0.7–4.0)
MCHC: 34.7 g/dL (ref 30.0–36.0)
MCV: 90.6 fl (ref 78.0–100.0)
Monocytes Absolute: 0.4 10*3/uL (ref 0.1–1.0)
Monocytes Relative: 8.5 % (ref 3.0–12.0)
Neutro Abs: 3.2 10*3/uL (ref 1.4–7.7)
Neutrophils Relative %: 62.8 % (ref 43.0–77.0)
Platelets: 173 10*3/uL (ref 150.0–400.0)
RBC: 4.73 Mil/uL (ref 3.87–5.11)
RDW: 12.2 % (ref 11.5–15.5)
WBC: 5.1 10*3/uL (ref 4.0–10.5)

## 2019-10-04 LAB — FOLLICLE STIMULATING HORMONE: FSH: 167.8 m[IU]/mL

## 2019-10-04 LAB — LIPID PANEL
Cholesterol: 152 mg/dL (ref 0–200)
HDL: 54.6 mg/dL (ref >39.00)
LDL Cholesterol: 88 mg/dL (ref 0–99)
NonHDL: 97.84
Total CHOL/HDL Ratio: 3
Triglycerides: 48 mg/dL (ref 0.0–149.0)
VLDL: 9.6 mg/dL (ref 0.0–40.0)

## 2019-10-04 LAB — TSH: TSH: 1.35 u[IU]/mL (ref 0.35–4.50)

## 2019-10-04 LAB — LUTEINIZING HORMONE: LH: 125.9 m[IU]/mL

## 2019-10-04 NOTE — Progress Notes (Signed)
Subjective:  Patient ID: Sarah Pratt, female    DOB: 18-May-1975  Age: 44 y.o. MRN: [id number]  CC: The primary encounter diagnosis was Weight loss, unintentional. Diagnoses of Amenorrhea, Mixed hyperlipidemia, Unintentional weight loss of 10% body weight within 6 months, Menopause, and Mild episode of recurrent major depressive disorder ([place]) were also pertinent to this visit.  HPI NINFA SEARCH presents for follow up on medication initiation for depression and abnormal pelvic exam.   This visit occurred during the SARS-CoV-2 public health emergency.  Safety protocols were in place, including screening questions prior to the visit, additional usage of staff PPE, and extensive cleaning of exam room while observing appropriate contact time as indicated for disinfecting solutions.   Resumed prozac one month go starting at 10 mg daily. Has increased to 20 mg last week. Previous treatment dose was 40 mg daily   Weight loss  ONGOING.  Down 20 lbs since 2019.  Thinks it is stress related. 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 Snacks frequently,  Only 2 real meals daily .  Has a protein bar and cheese  For breakfast.  Lunch at 1   Then dinner with family small portionOccasional night sweats, but usually cold and uses 4-5 blankets. . Not eating as much  Because by the time she finishes cooking she is not hungry.     Outpatient Medications Prior to Visit  Medication Sig Dispense Refill  . Cholecalciferol (D 1000) 1000 units capsule Take 1,000 Units by mouth daily.    Marland Kitchen FLUoxetine (PROZAC) 10 MG capsule Take 1 capsule (10 mg total) by mouth daily. 90 capsule 0  . fluticasone (FLONASE) 50 MCG/ACT nasal spray Place 2 sprays into both nostrils daily.    Marland Kitchen levonorgestrel (MIRENA) 20 MCG/24HR IUD by Intrauterine route.     . metoprolol tartrate (LOPRESSOR)  25 MG tablet Take 1 tablet (25 mg total) by mouth 2 (two) times daily as needed (for Tachycardia). (Patient not taking: Reported on 10/04/2019) 180 tablet 3   No facility-administered medications prior to visit.    Review of Systems;  Patient denies headache, fevers, malaise, unintentional weight loss, skin rash, eye pain, sinus congestion and sinus pain, sore throat, dysphagia,  hemoptysis , cough, dyspnea, wheezing, chest pain, palpitations, orthopnea, edema, abdominal pain, nausea, melena, diarrhea, constipation, flank pain, dysuria, hematuria, urinary  Frequency, nocturia, numbness, tingling, seizures,  Focal weakness, Loss of consciousness,  Tremor, insomnia, depression, anxiety, and suicidal ideation.      Objective:  BP 98/62 (BP Location: Left Arm, Patient Position: Sitting, Cuff Size: Normal)   Pulse 64   Temp 97.8 F (36.6 C) (Temporal)   Resp 14   Ht 5' 6.5" (1.689 m)   Wt 122 lb 3.2 oz (55.4 kg)   SpO2 98%   BMI 19.43 kg/m   BP Readings from Last 3 Encounters:  10/04/19 98/62  09/05/19 100/68  11/22/18 92/62    Wt Readings from Last 3 Encounters:  10/04/19 122 lb 3.2 oz (55.4 kg)  09/05/19 125 lb (56.7 kg)  11/22/18 132 lb 12 oz (60.2 kg)    General Appearance:    Alert, cooperative, no distress, appears stated age  Head:    Normocephalic, without obvious abnormality, atraumatic  Eyes:    PERRL, conjunctiva/corneas clear, EOM's intact, fundi    benign, both eyes  Ears:    Normal TM's and external ear canals, both ears  Nose:   Nares normal, septum midline, mucosa normal, no drainage    or sinus tenderness  Throat:   Lips, mucosa, and tongue normal; teeth and gums normal  Neck:   Supple, symmetrical, trachea midline, no adenopathy;    thyroid:  no enlargement/tenderness/nodules; no carotid   bruit or JVD  Back:     Symmetric, no curvature, ROM normal, no CVA tenderness  Lungs:     Clear to auscultation bilaterally, respirations unlabored  Chest Wall:    No  tenderness or deformity   Heart:    Regular rate and rhythm, S1 and S2 normal, no murmur, rub   or gallop  Breast Exam:    deferred  Abdomen:     Soft, non-tender, bowel sounds active all four quadrants,    no masses, no organomegaly  Genitalia:    Pelvic: cervix normal in appearance, external genitalia normal, no adnexal masses or tenderness, no cervical motion tenderness, rectovaginal septum normal, uterus normal size, shape, and consistency and vagina normal without discharge. IUD string noted   Extremities:   Extremities normal, atraumatic, no cyanosis or edema  Pulses:   2+ and symmetric all extremities  Skin:   Skin color, texture, turgor normal, no rashes or lesions  Lymph nodes:   Cervical, supraclavicular, and axillary nodes normal  Neurologic:   CNII-XII intact, normal strength, sensation and reflexes    throughout    No results found for: HGBA1C  Lab Results  Component Value Date   CREATININE 0.78 10/04/2019   CREATININE 0.81 10/24/2018   CREATININE 0.83 09/27/2017    Lab Results  Component Value Date   WBC 5.1 10/04/2019   HGB 14.9 10/04/2019   HCT 42.9 10/04/2019   PLT 173.0 10/04/2019   GLUCOSE 89 10/04/2019   CHOL 152 10/04/2019   TRIG 48.0 10/04/2019   HDL 54.60 10/04/2019   LDLCALC 88 10/04/2019   ALT 10 10/04/2019   AST 13 10/04/2019   NA 141 10/04/2019   K 4.2 10/04/2019   CL 105 10/04/2019   CREATININE 0.78 10/04/2019   BUN 23 10/04/2019   CO2 30 10/04/2019   TSH 1.35 10/04/2019     Assessment & Plan:   Problem List Items Addressed This Visit      Unprioritized   Depression    Complicated by anxiety.  Continue prozac 20 mg for two weeks and increase to 40 mg if symptoms persist        Hyperlipidemia   Relevant Orders   Lipid panel (Completed)   Menopause    Confirmed by FSH/LH levels today      Unintentional weight loss of 10% body weight within 6 months    Screening for causes other than depression done: she is not anemic,  hyperthyroid, and has a normal chest x ray .  If she continues to lose weight after one month of prozac at optimal dose, will add mirtazapine   Lab Results  Component Value Date   TSH 1.35 10/04/2019   Lab Results  Component Value Date   WBC 5.1 10/04/2019   HGB 14.9 10/04/2019   HCT 42.9 10/04/2019   MCV 90.6 10/04/2019   PLT 173.0 10/04/2019   No results found for: HGBA1C        Other Visit Diagnoses    Weight loss, unintentional    -  Primary   Relevant Orders   TSH (Completed)   CBC with Differential/Platelet (  Completed)   IBC + Ferritin (Completed)   Comprehensive metabolic panel (Completed)   DG Chest 2 View (Completed)   Amenorrhea       Relevant Orders   Follicle stimulating hormone (Completed)   LH (Completed)      I provided  30 minutes of  face-to-face time during this encounter reviewing patient's current problems and past surgeries, labs and imaging studies, providing counseling on the above mentioned problems , and coordination  of care . I am having Reis S. Gatchell maintain her levonorgestrel, fluticasone, Cholecalciferol, metoprolol tartrate, and FLUoxetine.  No orders of the defined types were placed in this encounter.   There are no discontinued medications.  Follow-up: No follow-ups on file.   [name], MD

## 2019-10-04 NOTE — Patient Instructions (Signed)
Your pelvic exam is normal.  Increase prozac to 40 mg after 2 weeks on 20 mg daily if needed,  And let me know   Consider trial of Remeron to stimulate appetite

## 2019-10-06 DIAGNOSIS — R634 Abnormal weight loss: Secondary | ICD-10-CM | POA: Insufficient documentation

## 2019-10-06 DIAGNOSIS — Z780 Asymptomatic menopausal state: Secondary | ICD-10-CM | POA: Insufficient documentation

## 2019-10-06 NOTE — Assessment & Plan Note (Addendum)
Screening for causes other than depression done: she is not anemic, hyperthyroid, and has a normal chest x ray .  If she continues to lose weight after one month of prozac at optimal dose, will add mirtazapine   Lab Results  Component Value Date   TSH 1.35 10/04/2019   Lab Results  Component Value Date   WBC 5.1 10/04/2019   HGB 14.9 10/04/2019   HCT 42.9 10/04/2019   MCV 90.6 10/04/2019   PLT 173.0 10/04/2019   No results found for: HGBA1C

## 2019-10-06 NOTE — Assessment & Plan Note (Signed)
Complicated by anxiety.  Continue prozac 20 mg for two weeks and increase to 40 mg if symptoms persist

## 2019-10-06 NOTE — Assessment & Plan Note (Signed)
Confirmed by FSH/LH levels today

## 2020-01-03 DIAGNOSIS — D2261 Melanocytic nevi of right upper limb, including shoulder: Secondary | ICD-10-CM | POA: Diagnosis not present

## 2020-01-03 DIAGNOSIS — D225 Melanocytic nevi of trunk: Secondary | ICD-10-CM | POA: Diagnosis not present

## 2020-01-03 DIAGNOSIS — L728 Other follicular cysts of the skin and subcutaneous tissue: Secondary | ICD-10-CM | POA: Diagnosis not present

## 2020-01-03 DIAGNOSIS — D2272 Melanocytic nevi of left lower limb, including hip: Secondary | ICD-10-CM | POA: Diagnosis not present

## 2020-01-03 DIAGNOSIS — D2262 Melanocytic nevi of left upper limb, including shoulder: Secondary | ICD-10-CM | POA: Diagnosis not present

## 2020-01-03 DIAGNOSIS — D2271 Melanocytic nevi of right lower limb, including hip: Secondary | ICD-10-CM | POA: Diagnosis not present

## 2020-01-03 DIAGNOSIS — L810 Postinflammatory hyperpigmentation: Secondary | ICD-10-CM | POA: Diagnosis not present

## 2020-01-07 ENCOUNTER — Other Ambulatory Visit: Payer: Self-pay

## 2020-01-07 ENCOUNTER — Encounter: Payer: Self-pay | Admitting: Family

## 2020-01-07 ENCOUNTER — Ambulatory Visit (INDEPENDENT_AMBULATORY_CARE_PROVIDER_SITE_OTHER): Payer: 59

## 2020-01-07 ENCOUNTER — Other Ambulatory Visit: Payer: Self-pay | Admitting: Internal Medicine

## 2020-01-07 ENCOUNTER — Ambulatory Visit: Payer: 59 | Admitting: Family

## 2020-01-07 VITALS — BP 100/70 | HR 65 | Ht 66.0 in | Wt 123.2 lb

## 2020-01-07 DIAGNOSIS — I341 Nonrheumatic mitral (valve) prolapse: Secondary | ICD-10-CM | POA: Diagnosis not present

## 2020-01-07 DIAGNOSIS — I471 Supraventricular tachycardia: Secondary | ICD-10-CM

## 2020-01-07 DIAGNOSIS — R000 Tachycardia, unspecified: Secondary | ICD-10-CM | POA: Diagnosis not present

## 2020-01-07 LAB — ECHOCARDIOGRAM COMPLETE
Area-P 1/2: 3.99 cm2
Height: 66 in
S' Lateral: 2 cm
Weight: 1972 oz

## 2020-01-07 MED ORDER — METOPROLOL TARTRATE 25 MG PO TABS
12.5000 mg | ORAL_TABLET | Freq: Two times a day (BID) | ORAL | 2 refills | Status: DC | PRN
Start: 2020-01-07 — End: 2021-04-22

## 2020-01-07 NOTE — Progress Notes (Signed)
Office Visit    Patient Name: Sarah Pratt Pratt of Encounter: 01/07/2020  Primary Care Provider:  [name], MD Primary Cardiologist:  Kathlyn Sacramento, MD Electrophysiologist:  None   Chief Complaint    ADAMARI FREDE is a 44 y.o. female with a hx of paroxysmal supraventricular tachycardia, mitral valve prolapse, asthma, congenital pectus excavatum, depression presents today for follow-up of paroxysmal SVT and mitral valve prolapse  Past Medical History    Past Medical History:  Diagnosis Pratt  . Allergy   . Arthritis   . Asthma   . Chest pain    a. 07/2017 St Echo: EF 65-70%, no rwma. No wall motion abnormalities w/ exercise. Nl study.  . Chicken pox   . Congenital pectus excavatum   . Depression   . Dyspnea on exertion   . Mitral valve prolapse    a. 10/2016 Echo: EF 55-60%, no rwma, mild MVP w/ triv MR, nl RV fxn.  Marland Kitchen PSVT (paroxysmal supraventricular tachycardia) ([place])    a. Prev eval @ [facility] - wishes to avoid RFCA @ this time.   Past Surgical History:  Procedure Laterality Pratt  . FRACTURE SURGERY Right 08/1998   shoulder fracture  . SHOULDER SURGERY Right 08/1998, 02/1999, 09/2013    Allergies  No Known Allergies  History of Present Illness    Sarah Pratt is a 44 y.o. female with a hx of paroxysmal supraventricular tachycardia, mitral valve prolapse, asthma, congenital pectus excavatum, depression.  She was last seen 11/22/2018 by Dr. Fletcher Anon.  Prolonged history of paroxysmal SVT.  Ablation previously considered at [facility] but her episodes became less frequent and she has since been monitored clinically.  Echo June 2018 with normal LVEF, mild mitral valve prolapse with trivial regurgitation.  Stress echocardiogram March 2019 with no evidence of ischemia.  She underwent 2 weeks Zio patch monitor June 2020 for increased palpitations and tachycardia which showed normal sinus rhythm with average heart rate of 75 bpm.  Noted rare PAC/PVC with intermittent sinus  tachycardia with maximum rate of 159 bpm.  No evidence of SVT.  When last seen 11/2018-some of her symptoms were suggestive of POTS.  She was recommended to increase fluid intake and avoid sudden changes in position.  She was given a prescription for as needed metoprolol.  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   Notices the past 4-6 weeks she has noticed an increase in her tachycardia. Reports dull headache for the last few weeks. Felt fine when sitting, but on standing her heart rate would increase to 110s-130s. Not with any exertional activity, will happen just with standing in church orthostatic cooking dinner. When she is tachycardic she notices she is more short of breath. Tells me the initial symptom is that she feels she cannot catch her breath and then she will look at her Apple Watch and see elevated heart rates. Some days it is initially asymptomatic but then she will notice progressive fatigue and her family/friends have noticed she is not herself.  Reports one episode of SVT last summer which she was able to resolve with vagal maneuvers.  Tells me her present tachycardia feels different than previous episodes of SVT.  She drinks only water and lemonade.  Drinks approximately 64 ounces of fluid per day.  No caffeine intake. Has been staying well hydrated with no change in symptoms.  Eat small regular meals throughout the day.  Add  extra salt to her food. No etoh, smoking, nor over the counter medications.   She brings home EKG recordings though does note that some of the dates may be incorrect.  EKG from the summer with SVT rate of 185 bpm.  Remainder sinus rhythm to sinus tachycardia with PVCs and PACs.  She is overall very frustrated about her symptoms and wishes she was able to do more.  Notes that this tachycardia makes her very fatigued.  EKGs/Labs/Other Studies Reviewed:   The following studies were  reviewed today:  Long-term monitor 11/06/2018 Normal sinus rhythm with an average heart rate of 75 bpm. Rare PACs and PVCs. Intermittent sinus tachycardia with a maximum heart rate of 159 bpm. No evidence of supraventricular tachycardia. No significant arrhythmia overall.  Echo 01/07/2020 1. Left ventricular ejection fraction, by estimation, is 60 to 65%. The  left ventricle has normal function. The left ventricle has no regional  wall motion abnormalities. Left ventricular diastolic parameters were  normal.   2. Right ventricular systolic function is normal. The right ventricular  size is normal. There is normal pulmonary artery systolic pressure. The  estimated right ventricular systolic pressure is 83.4 mmHg.   3. The mitral valve is normal in structure. Mild prolapse of the anterior  leaflet, Trivial mitral valve regurgitation. No evidence of mitral  stenosis.   EKG:  EKG is ordered today.  The ekg ordered today demonstrates sinus and 65 bpm with occasional PAC and no acute ST/T wave changes.  Recent Labs: 10/04/2019: ALT 10; BUN 23; Creatinine, Ser 0.78; Hemoglobin 14.9; Platelets 173.0; Potassium 4.2; Sodium 141; TSH 1.35  Recent Lipid Panel    Component Value Pratt/Time   CHOL 152 10/04/2019 0908   TRIG 48.0 10/04/2019 0908   HDL 54.60 10/04/2019 0908   CHOLHDL 3 10/04/2019 0908   VLDL 9.6 10/04/2019 0908   LDLCALC 88 10/04/2019 0908    Home Medications   Current Meds  Medication Sig  . Cholecalciferol (D 1000) 1000 units capsule Take 1,000 Units by mouth daily.  . fluticasone (FLONASE) 50 MCG/ACT nasal spray Place 2 sprays into both nostrils daily.  Marland Kitchen levonorgestrel (MIRENA) 20 MCG/24HR IUD by Intrauterine route.   . [DISCONTINUED] FLUoxetine (PROZAC) 10 MG capsule Take 1 capsule (10 mg total) by mouth daily.      Review of Systems    Review of Systems  Constitutional: Positive for malaise/fatigue. Negative for chills and fever.  Cardiovascular: Negative for  chest pain, dyspnea on exertion, leg swelling, near-syncope, orthopnea, palpitations and syncope.       (+) tachycardia  Respiratory: Positive for shortness of breath. Negative for cough and wheezing.   Gastrointestinal: Negative for nausea and vomiting.  Neurological: Negative for dizziness, light-headedness and weakness.   All other systems reviewed and are otherwise negative except as noted above.  Physical Exam    VS:  BP 100/70 (BP Location: Left Arm, Patient Position: Sitting, Cuff Size: Normal)   Pulse 65   Ht 5\' 6"  (1.676 m)   Wt 123 lb 4 oz (55.9 kg)   SpO2 99%   BMI 19.89 kg/m  , BMI Body mass index is 19.89 kg/m. GEN: Well nourished, well developed, in no acute distress. HEENT: normal. Neck: Supple, no JVD, carotid bruits, or masses. Cardiac: RRR, no murmurs, rubs, or gallops. No clubbing, cyanosis, edema.  Radials/DP/PT 2+ and equal bilaterally.  Respiratory:  Respirations regular and unlabored, clear to auscultation bilaterally. GI: Soft, nontender, nondistended, BS + x 4. MS: No deformity or  atrophy. Pectus excavatum. Skin: Warm and dry, no rash. Neuro:  Strength and sensation are intact. Psych: Normal affect.  Assessment & Plan    1. Paroxysmal SVT - Reports single episode over the summer which resolved with vagal maneuvers.  Reports this feels different than her present tachycardia.  Lab work June 2021 with primary care with normal electrolytes, hemoglobin, TSH.  2. Mitral valve prolapse -Echo in 2018 with mild mitral valve prolapse and trivial regurgitation.  Repeat echo ordered for reassessment as she reported more tachycardia.  Echo today performed after clinic with stable mild mitral valve prolapse and trivial MR.  3. Sinus tachycardia - Reports worsening of tachycardia over the last 4 to 6 weeks associated with fatigue and shortness of breath.  Notices heart rates consistently 110 to 130 bpm with standing associated with fatigue, shortness of breath.  No  improvement despite addition of salt, increased fluid intake, and regular meals.  Repeat echo for reassessment of valvular function with no acute findings.  Previous ZIO monitor 10/2018 with SR, infrequent PVC/PAC, and episodes of ST.  Will refer to EP for possible evaluation for POTS. Start Metoprolol Tartrate 12.5mg  twice daily as needed for tachycardia though use will be limited by low normal BP.  4. Pectus excavatum - Noted exam finding. Longstanding history. Report shortness of breath only with tachycardia.   Disposition: Echocardiogram. Follow up after echocardiogram with EP. Follow up in 1 year(s) with Dr. Fletcher Anon or APP  Loel Dubonnet, NP 01/07/2020, 7:29 PM

## 2020-01-07 NOTE — Patient Instructions (Addendum)
Medication Instructions:  Your physician has recommended you make the following change in your medication:   START Metoprolol Tartrate 12.5mg  (twice daily) as needed for tachycardia  *If you need a refill on your cardiac medications before your next appointment, please call your pharmacy*  Lab Work: No lab work today  Testing/Procedures: Your physician has requested that you have an echocardiogram. Echocardiography is a painless test that uses sound waves to create images of your heart. It provides your doctor with information about the size and shape of your heart and how well your heart's chambers and valves are working. This procedure takes approximately one hour. There are no restrictions for this procedure.  Follow-Up: At [facility], you and your health needs are our priority.  As part of our continuing mission to provide you with exceptional heart care, we have created designated Provider Care Teams.  These Care Teams include your primary Cardiologist (physician) and Advanced Practice Providers (APPs -  Physician Assistants and Nurse Practitioners) who all work together to provide you with the care you need, when you need it.  We recommend signing up for the patient portal called "MyChart".  Sign up information is provided on this After Visit Summary.  MyChart is used to connect with patients for Virtual Visits (Telemedicine).  Patients are able to view lab/test results, encounter notes, upcoming appointments, etc.  Non-urgent messages can be sent to your provider as well.   To learn more about what you can do with MyChart, go to [email or web address].    Your next appointment:   After echocardiogram in person with EP (electrophysiology)  Other Instructions Try drinking something with electrolytes.  Keep up the good work staying well hydrated.

## 2020-01-07 NOTE — Progress Notes (Signed)
Electrophysiology Office Note:    Date:  01/08/2020   ID:  Sarah Pratt, DOB 06-Feb-1976, MRN [id number]  PCP:  [name], MD  [facility] Cardiologist:  Kathlyn Sacramento, MD  [facility] Electrophysiologist:  None   Referring MD: [name], MD   Chief Complaint: PSVT  History of Present Illness:    Sarah Pratt is a 44 y.o. female with a hx of MV prolapse, asthma, depression and PSVT who presents to clinic to discuss symptomatic SVT. She was previously worked up at [facility] for ablation but this was not performed given her episodes became less frequent. She still has intermittent episodes of SVT that she is able to terminate with vagal maneuvers.  Today she is presenting for a separate issue.  She tells me that she has noticed symptomatic tachycardia mostly when she stands.  She also notes the tachycardia when she is in the shower especially with warmer water.  No syncopal episodes.  She has tried to self treat at home by increasing her hydration.  She has used compression stockings in the past but does not use those recently.  She tells me that she has a high salt intake which is a longstanding issue.  She "craves salt".  With episodes of tachycardia, she is intolerant of much exertion and describes shortness of breath.  No chest pain.  She works as a Research scientist (physical sciences).  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  She tells me that this is a 71 a week job.  She recently opened a storefront which is leading to some stress.  Her husband is suggested that she drop this side business to alleviate some of her stressors.  She stays active walking frequently for over 30 minutes for exercise.  She tolerates exercise well and seems to distinguish elevated heart rates with exercise from the symptoms she is having with a resting tachycardia.  Past Medical History:  Diagnosis Date  . Allergy   . Arthritis   . Asthma   . Chest pain    a. 07/2017  St Echo: EF 65-70%, no rwma. No wall motion abnormalities w/ exercise. Nl study.  . Chicken pox   . Congenital pectus excavatum   . Depression   . Dyspnea on exertion   . Mitral valve prolapse    a. 10/2016 Echo: EF 55-60%, no rwma, mild MVP w/ triv MR, nl RV fxn.  Marland Kitchen PSVT (paroxysmal supraventricular tachycardia) ([place])    a. Prev eval @ [facility] - wishes to avoid RFCA @ this time.    Past Surgical History:  Procedure Laterality Date  . FRACTURE SURGERY Right 08/1998   shoulder fracture  . SHOULDER SURGERY Right 08/1998, 02/1999, 09/2013    Current Medications: Current Meds  Medication Sig  . Ascorbic Acid (VITAMIN C) 1000 MG tablet Take 1,000 mg by mouth daily.  . Cholecalciferol (D 1000) 1000 units capsule Take 1,000 Units by mouth daily.  Marland Kitchen FLUoxetine (PROZAC) 10 MG capsule TAKE 1 CAPSULE BY MOUTH DAILY.  . fluticasone (FLONASE) 50 MCG/ACT nasal spray Place 2 sprays into both nostrils as needed.   Marland Kitchen levonorgestrel (MIRENA) 20 MCG/24HR IUD by Intrauterine route.   . loratadine (CLARITIN) 10 MG tablet Take 10 mg by mouth as needed for allergies.  . metoprolol tartrate (LOPRESSOR) 25 MG tablet Take 0.5 tablets (12.5 mg total) by mouth 2 (two) times daily as needed (for tachycardia or palpitations).  . zinc gluconate 50 MG tablet  Take 50 mg by mouth daily.     Allergies:   Patient has no known allergies.   Social History   Socioeconomic History  . Marital status: Married    Spouse name: Not on file  . Number of children: Not on file  . Years of education: Not on file  . Highest education level: Not on file  Occupational History  . Not on file  Tobacco Use  . Smoking status: Never Smoker  . Smokeless tobacco: Never Used  Vaping Use  . Vaping Use: Never used  Substance and Sexual Activity  . Alcohol use: No    Alcohol/week: 0.0 standard drinks  . Drug use: No  . Sexual activity: Yes    Partners: Male  Other Topics Concern  . Not on file  Social History Narrative  . Not on  file   Social Determinants of Health   Financial Resource Strain:   . Difficulty of Paying Living Expenses: Not on file  Food Insecurity:   . Worried About Charity fundraiser in the Last Year: Not on file  . Ran Out of Food in the Last Year: Not on file  Transportation Needs:   . Lack of Transportation (Medical): Not on file  . Lack of Transportation (Non-Medical): Not on file  Physical Activity:   . Days of Exercise per Week: Not on file  . Minutes of Exercise per Session: Not on file  Stress:   . Feeling of Stress : Not on file  Social Connections:   . Frequency of Communication with Friends and Family: Not on file  . Frequency of Social Gatherings with Friends and Family: Not on file  . Attends Religious Services: Not on file  . Active Member of Clubs or Organizations: Not on file  . Attends Archivist Meetings: Not on file  . Marital Status: Not on file     Family History: The patient's family history includes Arthritis in her father; Atrial fibrillation in her mother; Breast cancer in her maternal aunt; Colon cancer in her maternal grandmother; Diabetes (age of onset: 55) in her mother; Hyperlipidemia in her father and mother; Multiple sclerosis (age of onset: 60) in her sister; Parkinson's disease (age of onset: 63) in her father; Rheum arthritis (age of onset: 38) in her sister; Von Willebrand disease in her son.  ROS:   Please see the history of present illness.    All other systems reviewed and are negative.  EKGs/Labs/Other Studies Reviewed:    The following studies were reviewed today: Echo, zio, ecg  01/07/2020 Echo 1. Left ventricular ejection fraction, by estimation, is 60 to 65%. The  left ventricle has normal function. The left ventricle has no regional  wall motion abnormalities. Left ventricular diastolic parameters were  normal.  2. Right ventricular systolic function is normal. The right ventricular  size is normal. There is normal pulmonary  artery systolic pressure. The  estimated right ventricular systolic pressure is 16.6 mmHg.  3. The mitral valve is normal in structure. Mild prolapse of the anterior  leaflet, Trivial mitral valve regurgitation. No evidence of mitral  stenosis.   11/06/2018 Zio Sinus with average HR 75. Rare PAC/PVC. Intermittent sinus tach with HR 159.    EKG:  The ekg ordered today demonstrates sinus bradycardia.  Recent Labs: 10/04/2019: ALT 10; BUN 23; Creatinine, Ser 0.78; Hemoglobin 14.9; Platelets 173.0; Potassium 4.2; Sodium 141; TSH 1.35  Recent Lipid Panel    Component Value Date/Time   CHOL 152  10/04/2019 0908   TRIG 48.0 10/04/2019 0908   HDL 54.60 10/04/2019 0908   CHOLHDL 3 10/04/2019 0908   VLDL 9.6 10/04/2019 0908   LDLCALC 88 10/04/2019 0908    Physical Exam:    VS:  BP 102/69   Pulse (!) 54   Ht 5\' 6"  (1.676 m)   Wt 124 lb (56.2 kg)   SpO2 97%   BMI 20.01 kg/m     I had the patient stand in the exam room for 3 minutes and monitor her heart rate throughout that period.  The maximum heart rate after 3 minutes of standing was 80 bpm.  Wt Readings from Last 3 Encounters:  01/08/20 124 lb (56.2 kg)  01/07/20 123 lb 4 oz (55.9 kg)  10/04/19 122 lb 3.2 oz (55.4 kg)     GEN:  Well nourished, well developed in no acute distress HEENT: Normal NECK: No JVD; No carotid bruits LYMPHATICS: No lymphadenopathy CARDIAC: RRR, no murmurs, rubs, gallops RESPIRATORY:  Clear to auscultation without rales, wheezing or rhonchi  ABDOMEN: Soft, non-tender, non-distended MUSCULOSKELETAL:  No edema; No deformity  SKIN: Warm and dry NEUROLOGIC:  Alert and oriented x 3 PSYCHIATRIC:  Normal affect   ASSESSMENT:    1. Tachycardia   2. PSVT (paroxysmal supraventricular tachycardia) (HCC)    PLAN:    In order of problems listed above:  1. Tachycardia/PSVT The patient describes resting episodes of rapid heart rates greater than 100 bpm.  These episodes do not have clear triggers and are  symptomatic with shortness of breath and exercise intolerance.  Episodes seem to come and go. From the description of her symptoms, her syndrome could be consistent with a diagnosis of POTS although during today's exam she did not have any increased heart rates with standing.  She is already tried several behavioral changes at home including increasing her hydration, increasing salt intake.  We spent a lot of time today discussing possible treatment options and discussed how this can be a very challenging diagnosis to manage.  I have recommended that she elevate the head of her bed by 6 to 8 inches, use above the knee compression garments, increase hydration including at least 12 ounces of fluid intake before getting out of bed in the morning, increasing salt intake.  I have encouraged her to start taking the metoprolol that was prescribed to her previously.  She will monitor her blood pressure while starting this and we can uptitrate as needed.  I have also encouraged her to continue her exercise regimen with daily aerobic exercise as tolerated. I would like to get a treadmill ECG test to document her chronotropic competence and heart rate response to exercise.  I am specifically interested in her heart rates during the recovery phase.   Medication Adjustments/Labs and Tests Ordered: Current medicines are reviewed at length with the patient today.  Concerns regarding medicines are outlined above.  Orders Placed This Encounter  Procedures  . Exercise Tolerance Test  . EKG 12-Lead   No orders of the defined types were placed in this encounter.   Patient Instructions  Medication Instructions:  Your physician recommends that you continue on your current medications as directed. Please refer to the Current Medication list given to you today.  *If you need a refill on your cardiac medications before your next appointment, please call your pharmacy*   Lab Work: .COVID PRE- TEST: You will need a  COVID TEST prior to the procedure:  LOCATION: [place]  Drive-Thru Testing site.  DATE/TIME:  ____________. You can stop by anytime between 8:00 am - 1:00 pm    Testing/Procedures: Your physician has requested that you have an exercise tolerance test. For further information please visit [email or web address].    DO NOT drink or eat foods with caffeine for 24 hours before the test. (Chocolate, coffee, tea, decaf coffee/tea, or energy drinks)  DO NOT smoke for 4 hours before your test.  If you use an inhaler, bring it with you to the test.  Wear comfortable shoes and clothing. Women do not wear dresses.    Follow-Up: At [facility], you and your health needs are our priority.  As part of our continuing mission to provide you with exceptional heart care, we have created designated Provider Care Teams.  These Care Teams include your primary Cardiologist (physician) and Advanced Practice Providers (APPs -  Physician Assistants and Nurse Practitioners) who all work together to provide you with the care you need, when you need it.  We recommend signing up for the patient portal called "MyChart".  Sign up information is provided on this After Visit Summary.  MyChart is used to connect with patients for Virtual Visits (Telemedicine).  Patients are able to view lab/test results, encounter notes, upcoming appointments, etc.  Non-urgent messages can be sent to your provider as well.   To learn more about what you can do with MyChart, go to [email or web address].    Your next appointment:   3 month(s)  The format for your next appointment:   In Person  Provider:   Dr. Quentin Ore   Thank you for choosing [facility]!!   Trinidad Curet, RN [phone]    Other Instructions       Signed, Lars Mage, MD, [facility]  01/08/2020 1:26 PM    Electrophysiology [facility]

## 2020-01-08 ENCOUNTER — Telehealth: Payer: Self-pay

## 2020-01-08 ENCOUNTER — Encounter: Payer: Self-pay | Admitting: Cardiology

## 2020-01-08 ENCOUNTER — Ambulatory Visit: Payer: 59 | Admitting: Cardiology

## 2020-01-08 VITALS — BP 102/69 | HR 54 | Ht 66.0 in | Wt 124.0 lb

## 2020-01-08 DIAGNOSIS — R000 Tachycardia, unspecified: Secondary | ICD-10-CM

## 2020-01-08 DIAGNOSIS — I471 Supraventricular tachycardia: Secondary | ICD-10-CM | POA: Diagnosis not present

## 2020-01-08 NOTE — Addendum Note (Signed)
Addended by: Lars Mage T on: 01/08/2020 03:38 PM   Modules accepted: Level of Service

## 2020-01-08 NOTE — Telephone Encounter (Signed)
-----   Message from Loel Dubonnet, NP sent at 01/07/2020  8:11 PM EDT ----- Normal heart pumping function. Stable mild mitral valve prolapse. Overall stable result compared to previous!  Of note, she has appt 01/08/20 at 10:40 with Dr. Quentin Ore. May be able to review at clinic visit.

## 2020-01-08 NOTE — Telephone Encounter (Signed)
Call to patient to review echo results.    Pt verbalized understanding and has no further questions at this time.    Advised pt to call for any further questions or concerns.  No further orders.   

## 2020-01-08 NOTE — Patient Instructions (Addendum)
Medication Instructions:  Your physician recommends that you continue on your current medications as directed. Please refer to the Current Medication list given to you today.  *If you need a refill on your cardiac medications before your next appointment, please call your pharmacy*   Lab Work: .COVID PRE- TEST: You will need a COVID TEST prior to the procedure:  LOCATION: [place] Drive-Thru Testing site.  DATE/TIME:  ____________. You can stop by anytime between 8:00 am - 1:00 pm    Testing/Procedures: Your physician has requested that you have an exercise tolerance test. For further information please visit [email or web address].    DO NOT drink or eat foods with caffeine for 24 hours before the test. (Chocolate, coffee, tea, decaf coffee/tea, or energy drinks)  DO NOT smoke for 4 hours before your test.  If you use an inhaler, bring it with you to the test.  Wear comfortable shoes and clothing. Women do not wear dresses.    Follow-Up: At [facility], you and your health needs are our priority.  As part of our continuing mission to provide you with exceptional heart care, we have created designated Provider Care Teams.  These Care Teams include your primary Cardiologist (physician) and Advanced Practice Providers (APPs -  Physician Assistants and Nurse Practitioners) who all work together to provide you with the care you need, when you need it.  We recommend signing up for the patient portal called "MyChart".  Sign up information is provided on this After Visit Summary.  MyChart is used to connect with patients for Virtual Visits (Telemedicine).  Patients are able to view lab/test results, encounter notes, upcoming appointments, etc.  Non-urgent messages can be sent to your provider as well.   To learn more about what you can do with MyChart, go to [email or web address].    Your next appointment:   3 month(s)  The format for your next appointment:   In  Person  Provider:   Dr. Quentin Ore   Thank you for choosing [facility]!!   Trinidad Curet, RN [phone]    Other Instructions

## 2020-01-15 ENCOUNTER — Other Ambulatory Visit
Admission: RE | Admit: 2020-01-15 | Discharge: 2020-01-15 | Disposition: A | Discharge status: Home / Self Care | Payer: 59 | Source: Ambulatory Visit | Attending: Cardiology | Admitting: Cardiology

## 2020-01-15 ENCOUNTER — Other Ambulatory Visit: Payer: Self-pay

## 2020-01-15 DIAGNOSIS — Z20822 Contact with and (suspected) exposure to covid-19: Secondary | ICD-10-CM | POA: Diagnosis not present

## 2020-01-15 DIAGNOSIS — Z01812 Encounter for preprocedural laboratory examination: Secondary | ICD-10-CM | POA: Diagnosis not present

## 2020-01-15 LAB — SARS CORONAVIRUS 2 (TAT 6-24 HRS): SARS Coronavirus 2: NEGATIVE

## 2020-01-17 ENCOUNTER — Other Ambulatory Visit: Payer: Self-pay

## 2020-01-17 ENCOUNTER — Ambulatory Visit (INDEPENDENT_AMBULATORY_CARE_PROVIDER_SITE_OTHER): Payer: 59

## 2020-01-17 DIAGNOSIS — R000 Tachycardia, unspecified: Secondary | ICD-10-CM

## 2020-01-17 DIAGNOSIS — I471 Supraventricular tachycardia: Secondary | ICD-10-CM

## 2020-01-21 LAB — EXERCISE TOLERANCE TEST
Estimated workload: 10.6 METS
Exercise duration (min): 4 min
Exercise duration (sec): 59 s
MPHR: 174 {beats}/min
Peak HR: 176 {beats}/min
Percent HR: 101 %
RPE: 14
Rest HR: 96 {beats}/min

## 2020-04-15 ENCOUNTER — Other Ambulatory Visit: Payer: Self-pay

## 2020-04-15 ENCOUNTER — Encounter: Payer: Self-pay | Admitting: Cardiology

## 2020-04-15 ENCOUNTER — Ambulatory Visit: Payer: 59 | Admitting: Cardiology

## 2020-04-15 VITALS — BP 90/70 | HR 81 | Ht 66.0 in | Wt 125.0 lb

## 2020-04-15 DIAGNOSIS — I471 Supraventricular tachycardia, unspecified: Secondary | ICD-10-CM

## 2020-04-15 DIAGNOSIS — I341 Nonrheumatic mitral (valve) prolapse: Secondary | ICD-10-CM | POA: Diagnosis not present

## 2020-04-15 NOTE — Progress Notes (Signed)
Electrophysiology Office Follow up Visit Note:    Date:  04/15/2020   ID:  Sarah Pratt, DOB 1975-05-22, MRN [id number]  PCP:  [name], MD  [facility] Cardiologist:  Kathlyn Sacramento, MD  [place] Electrophysiologist:  None    Interval History:    Sarah Pratt is a 44 y.o. female who presents for a follow up visit.  They were last seen in clinic January 08, 2020 for intermittent episodes of tachycardia.  At that appointment question was raised about possible POTS although evaluation that day was not suggestive given no increased heart rates with standing.  I made recommendations at that visit for her to increase the head of the bed by 6 to 8 inches, use compression garments and to stay adequately hydrated.  I advised her to liberalize her salt intake.  Echocardiogram and exercise tolerance test were ordered both of which were normal.   Past Medical History:  Diagnosis Date  . Allergy   . Arthritis   . Asthma   . Chest pain    a. 07/2017 St Echo: EF 65-70%, no rwma. No wall motion abnormalities w/ exercise. Nl study.  . Chicken pox   . Congenital pectus excavatum   . Depression   . Dyspnea on exertion   . Mitral valve prolapse    a. 10/2016 Echo: EF 55-60%, no rwma, mild MVP w/ triv MR, nl RV fxn.  Marland Kitchen PSVT (paroxysmal supraventricular tachycardia) ([place])    a. Prev eval @ [facility] - wishes to avoid RFCA @ this time.    Past Surgical History:  Procedure Laterality Date  . FRACTURE SURGERY Right 08/1998   shoulder fracture  . SHOULDER SURGERY Right 08/1998, 02/1999, 09/2013    Current Medications: Current Meds  Medication Sig  . Ascorbic Acid (VITAMIN C) 1000 MG tablet Take 1,000 mg by mouth daily.  . Cholecalciferol (D 1000) 1000 units capsule Take 1,000 Units by mouth daily.  Marland Kitchen FLUoxetine (PROZAC) 10 MG capsule TAKE 1 CAPSULE BY MOUTH DAILY.  . fluticasone (FLONASE) 50 MCG/ACT nasal spray Place 2 sprays into both nostrils as needed.   Marland Kitchen levonorgestrel  (MIRENA) 20 MCG/24HR IUD by Intrauterine route.   . loratadine (CLARITIN) 10 MG tablet Take 10 mg by mouth as needed for allergies.  Marland Kitchen zinc gluconate 50 MG tablet Take 50 mg by mouth daily.     Allergies:   Patient has no known allergies.   Social History   Socioeconomic History  . Marital status: Married    Spouse name: Not on file  . Number of children: Not on file  . Years of education: Not on file  . Highest education level: Not on file  Occupational History  . Not on file  Tobacco Use  . Smoking status: Never Smoker  . Smokeless tobacco: Never Used  Vaping Use  . Vaping Use: Never used  Substance and Sexual Activity  . Alcohol use: No    Alcohol/week: 0.0 standard drinks  . Drug use: No  . Sexual activity: Yes    Partners: Male  Other Topics Concern  . Not on file  Social History Narrative  . Not on file   Social Determinants of Health   Financial Resource Strain:   . Difficulty of Paying Living Expenses: Not on file  Food Insecurity:   . Worried About Charity fundraiser in the Last Year: Not on file  . Ran Out of Food in the Last Year: Not on file  Transportation  Needs:   . Lack of Transportation (Medical): Not on file  . Lack of Transportation (Non-Medical): Not on file  Physical Activity:   . Days of Exercise per Week: Not on file  . Minutes of Exercise per Session: Not on file  Stress:   . Feeling of Stress : Not on file  Social Connections:   . Frequency of Communication with Friends and Family: Not on file  . Frequency of Social Gatherings with Friends and Family: Not on file  . Attends Religious Services: Not on file  . Active Member of Clubs or Organizations: Not on file  . Attends Archivist Meetings: Not on file  . Marital Status: Not on file     Family History: The patient's family history includes Arthritis in her father; Atrial fibrillation in her mother; Breast cancer in her maternal aunt; Colon cancer in her maternal  grandmother; Diabetes (age of onset: 63) in her mother; Hyperlipidemia in her father and mother; Multiple sclerosis (age of onset: 65) in her sister; Parkinson's disease (age of onset: 73) in her father; Rheum arthritis (age of onset: 66) in her sister; Von Willebrand disease in her son.  ROS:   Please see the history of present illness.    All other systems reviewed and are negative.  EKGs/Labs/Other Studies Reviewed:    The following studies were reviewed today: Exercise tolerance test, echo, prior notes  March 18, 2020 exercise tolerance test personally reviewed Normal sinus rhythm at baseline. Exercise capacity 10.6 METS, 85% MPHR achieved. Max HR during exercise 176bpm. Recovery HR 136 @ 37min, 96@ 65min, 100 @ 6 min. AV conduction normal with exertion. Rare ectopics with exertion. Normal BP response to exercise. Normal chronotropic response to exercise and recovery.   January 07, 2020 echo personally reviewed Left ventricular function normal, 60%, right ventricular function normal Mild prolapse of the anterior leaflet of the mitral valve without significant mitral regurgitation  EKG:  The ekg ordered today demonstrates sinus bradycardia  Recent Labs: 10/04/2019: ALT 10; BUN 23; Creatinine, Ser 0.78; Hemoglobin 14.9; Platelets 173.0; Potassium 4.2; Sodium 141; TSH 1.35  Recent Lipid Panel    Component Value Date/Time   CHOL 152 10/04/2019 0908   TRIG 48.0 10/04/2019 0908   HDL 54.60 10/04/2019 0908   CHOLHDL 3 10/04/2019 0908   VLDL 9.6 10/04/2019 0908   LDLCALC 88 10/04/2019 0908    Physical Exam:    VS:  BP 90/70   Pulse 81   Ht 5\' 6"  (1.676 m)   Wt 125 lb (56.7 kg)   SpO2 99%   BMI 20.18 kg/m     Wt Readings from Last 3 Encounters:  04/15/20 125 lb (56.7 kg)  01/08/20 124 lb (56.2 kg)  01/07/20 123 lb 4 oz (55.9 kg)     GEN:  Well nourished, well developed in no acute distress HEENT: Normal NECK: No JVD; No carotid bruits LYMPHATICS: No  lymphadenopathy CARDIAC: RRR, no murmurs, rubs, gallops RESPIRATORY:  Clear to auscultation without rales, wheezing or rhonchi  ABDOMEN: Soft, non-tender, non-distended MUSCULOSKELETAL:  No edema; No deformity  SKIN: Warm and dry NEUROLOGIC:  Alert and oriented x 3 PSYCHIATRIC:  Normal affect   ASSESSMENT:    No diagnosis found. PLAN:    In order of problems listed above:  1. Tachycardia/PSVT Work-up does not show sustained arrhythmias.  Exercise tolerance test shows good exercise capacity without inducible arrhythmia.  I suspect her episodes of tachycardia are related to intermittent sinus tachycardia.  These seem  to have clear triggers such as prolonged standing, stress.  I don't recommend that she start the metoprolol.  She has not been taking this regularly.  2.  Mild prolapse of the anterior leaflet of mitral valve No significant mitral vegetation   Follow-up 1 year  Medication Adjustments/Labs and Tests Ordered: Current medicines are reviewed at length with the patient today.  Concerns regarding medicines are outlined above.  No orders of the defined types were placed in this encounter.  No orders of the defined types were placed in this encounter.    Signed, Lars Mage, MD, [facility]  04/15/2020 9:38 AM    Electrophysiology [facility]

## 2020-04-15 NOTE — Patient Instructions (Signed)
Medication Instructions:  Your physician recommends that you continue on your current medications as directed. Please refer to the Current Medication list given to you today. *If you need a refill on your cardiac medications before your next appointment, please call your pharmacy*  Lab Work: None ordered. If you have labs (blood work) drawn today and your tests are completely normal, you will receive your results only by: . MyChart Message (if you have MyChart) OR . A paper copy in the mail If you have any lab test that is abnormal or we need to change your treatment, we will call you to review the results.  Testing/Procedures: None ordered.  Follow-Up: At [facility], you and your health needs are our priority.  As part of our continuing mission to provide you with exceptional heart care, we have created designated Provider Care Teams.  These Care Teams include your primary Cardiologist (physician) and Advanced Practice Providers (APPs -  Physician Assistants and Nurse Practitioners) who all work together to provide you with the care you need, when you need it.  Your next appointment:   Your physician wants you to follow-up in: one year with Dr. Lambert.   You will receive a reminder letter in the mail two months in advance. If you don't receive a letter, please call our office to schedule the follow-up appointment.    

## 2020-09-02 ENCOUNTER — Telehealth: Payer: Self-pay | Admitting: Cardiology

## 2020-09-02 NOTE — Telephone Encounter (Signed)
Sent mychart message with instructions on how to send rhythm strips via mychart.

## 2020-09-02 NOTE — Telephone Encounter (Signed)
Strips received and reviewed with AT.  PVC's.  Advised Pt via mychart.  All questions answered.

## 2020-09-02 NOTE — Telephone Encounter (Signed)
Patient calling  States that she had a different arrhythmia yesterday and is still not feeling right She took an EKG at home and would like to know if there is a way she can upload for review  Please call to discuss

## 2021-01-07 ENCOUNTER — Other Ambulatory Visit: Payer: Self-pay

## 2021-01-07 DIAGNOSIS — D2272 Melanocytic nevi of left lower limb, including hip: Secondary | ICD-10-CM | POA: Diagnosis not present

## 2021-01-07 DIAGNOSIS — D2261 Melanocytic nevi of right upper limb, including shoulder: Secondary | ICD-10-CM | POA: Diagnosis not present

## 2021-01-07 DIAGNOSIS — D2262 Melanocytic nevi of left upper limb, including shoulder: Secondary | ICD-10-CM | POA: Diagnosis not present

## 2021-01-07 DIAGNOSIS — L309 Dermatitis, unspecified: Secondary | ICD-10-CM | POA: Diagnosis not present

## 2021-01-07 MED ORDER — TRIAMCINOLONE ACETONIDE 0.1 % EX CREA
TOPICAL_CREAM | CUTANEOUS | 0 refills | Status: DC
Start: 2021-01-07 — End: 2022-02-21
  Filled 2021-01-07: qty 15, 30d supply, fill #0

## 2021-02-19 ENCOUNTER — Other Ambulatory Visit: Payer: Self-pay

## 2021-02-19 ENCOUNTER — Encounter: Payer: Self-pay | Admitting: Internal Medicine

## 2021-02-19 ENCOUNTER — Ambulatory Visit (INDEPENDENT_AMBULATORY_CARE_PROVIDER_SITE_OTHER): Payer: 59 | Admitting: Internal Medicine

## 2021-02-19 VITALS — BP 110/68 | HR 88 | Temp 96.6°F | Ht 65.98 in | Wt 132.4 lb

## 2021-02-19 DIAGNOSIS — F330 Major depressive disorder, recurrent, mild: Secondary | ICD-10-CM

## 2021-02-19 DIAGNOSIS — Z780 Asymptomatic menopausal state: Secondary | ICD-10-CM | POA: Diagnosis not present

## 2021-02-19 DIAGNOSIS — G8929 Other chronic pain: Secondary | ICD-10-CM

## 2021-02-19 DIAGNOSIS — N912 Amenorrhea, unspecified: Secondary | ICD-10-CM

## 2021-02-19 DIAGNOSIS — M25511 Pain in right shoulder: Secondary | ICD-10-CM | POA: Diagnosis not present

## 2021-02-19 DIAGNOSIS — E559 Vitamin D deficiency, unspecified: Secondary | ICD-10-CM

## 2021-02-19 DIAGNOSIS — Z0000 Encounter for general adult medical examination without abnormal findings: Secondary | ICD-10-CM

## 2021-02-19 DIAGNOSIS — Z1211 Encounter for screening for malignant neoplasm of colon: Secondary | ICD-10-CM

## 2021-02-19 DIAGNOSIS — Z803 Family history of malignant neoplasm of breast: Secondary | ICD-10-CM

## 2021-02-19 DIAGNOSIS — Z8679 Personal history of other diseases of the circulatory system: Secondary | ICD-10-CM

## 2021-02-19 DIAGNOSIS — D751 Secondary polycythemia: Secondary | ICD-10-CM | POA: Diagnosis not present

## 2021-02-19 DIAGNOSIS — R634 Abnormal weight loss: Secondary | ICD-10-CM

## 2021-02-19 DIAGNOSIS — Q676 Pectus excavatum: Secondary | ICD-10-CM

## 2021-02-19 MED ORDER — BUPROPION HCL ER (XL) 150 MG PO TB24
150.0000 mg | ORAL_TABLET | Freq: Every day | ORAL | 2 refills | Status: DC
  Filled 2021-02-19: qty 30, 30d supply, fill #0
  Filled 2021-03-27: qty 30, 30d supply, fill #1
  Filled 2021-06-01: qty 30, 30d supply, fill #2

## 2021-02-19 NOTE — Assessment & Plan Note (Signed)
Seasonal mostly.  Not on meds currently will resume prozac if symptoms worsen.

## 2021-02-19 NOTE — Patient Instructions (Signed)
Your annual mammogram has been ordered.  You are encouraged (required) to call to make your appointment at [facility]   If your labs confirm menopause,  I will make the referral for IUD removal  .colonoscopy referral in process as well  Wellbutrin 150 mg once daily in the AM

## 2021-02-19 NOTE — Assessment & Plan Note (Signed)
She has not had annual screening since 2018 cue to pectus excavatum.

## 2021-02-19 NOTE — Progress Notes (Signed)
Patient ID: Sarah Pratt, female    DOB: Jul 29, 1975  Age: 45 y.o. MRN: [id number]  The patient is here for annual preventive  examination and management of other chronic and acute problems.   The risk factors are reflected in the social history.  The roster of all physicians providing medical care to patient - is listed in the Snapshot section of the chart.  Activities of daily living:  The patient is 100% independent in all ADLs: dressing, toileting, feeding as well as independent mobility  Home safety : The patient has smoke detectors in the home. They wear seatbelts.  There are no firearms at home. There is no violence in the home.   There is no risks for hepatitis, STDs or HIV. There is no   history of blood transfusion. They have no travel history to infectious disease endemic areas of the world.  The patient has seen their dentist in the last six month. They have seen their eye doctor in the last year. They admit to slight hearing difficulty with regard to whispered voices and some television programs.  They have deferred audiologic testing in the last year.  They do not  have excessive sun exposure. Discussed the need for sun protection: hats, long sleeves and use of sunscreen if there is significant sun exposure.   Diet: the importance of a healthy diet is discussed. They do have a healthy diet.  The benefits of regular aerobic exercise were discussed. She walks 4 times per week ,  20 minutes.   Depression screen: there are no signs or vegative symptoms of depression- irritability, change in appetite, anhedonia, sadness/tearfullness.  The following portions of the patient's history were reviewed and updated as appropriate: allergies, current medications, past family history, past medical history,  past surgical history, past social history  and problem list.  Visual acuity was not assessed per patient preference since she has regular follow up with her ophthalmologist. Hearing and  body mass index were assessed and reviewed.   During the course of the visit the patient was educated and counseled about appropriate screening and preventive services including : fall prevention , diabetes screening, nutrition counseling, colorectal cancer screening, and recommended immunizations.    CC: The primary encounter diagnosis was Colon cancer screening. Diagnoses of Family history of breast cancer in female, History of PSVT (paroxysmal supraventricular tachycardia), Mild episode of recurrent major depressive disorder ([place]), Vitamin D deficiency, Amenorrhea, Polycythemia, Chronic right shoulder pain, Congenital pectus excavatum, Encounter for preventive health examination, Menopause, and Unintentional weight loss of 10% body weight within 6 months were also pertinent to this visit.  Inadequate sleep  aggravated by bilateral shoulder pain .  Right pain is chronic and workmen's comp  per Sarah Pratt (total shoulder  replacement needed), her  left shoulder pain  is new .   History Sarah Pratt has a past medical history of Allergy, Arthritis, Asthma, Chest pain, Chicken pox, Congenital pectus excavatum, Depression, Dyspnea on exertion, Mitral valve prolapse, and PSVT (paroxysmal supraventricular tachycardia) (Sarah Pratt).   She has a past surgical history that includes Fracture surgery (Right, 08/1998) and Shoulder surgery (Right, 08/1998, 02/1999, 09/2013).   Her family history includes Arthritis in her father; Atrial fibrillation in her mother; Breast cancer in her maternal aunt; Colon cancer in her maternal grandmother; Diabetes (age of onset: 60) in her mother; Hyperlipidemia in her father and mother; Multiple sclerosis (age of onset: 40) in her sister; Parkinson's disease (age of onset: 84) in her father; Rheum arthritis (age of  onset: 33) in her sister; Von Willebrand disease in her son.She reports that she has never smoked. She has never used smokeless tobacco. She reports that she does not drink alcohol and  does not use drugs.  Outpatient Medications Prior to Visit  Medication Sig Dispense Refill   Ascorbic Acid (VITAMIN C) 1000 MG tablet Take 1,000 mg by mouth daily.     Cholecalciferol 25 MCG (1000 UT) capsule Take 1,000 Units by mouth daily.     FLUoxetine (PROZAC) 10 MG capsule TAKE 1 CAPSULE BY MOUTH DAILY. 90 capsule 0   fluticasone (FLONASE) 50 MCG/ACT nasal spray Place 2 sprays into both nostrils as needed.      levonorgestrel (MIRENA) 20 MCG/24HR IUD by Intrauterine route.      loratadine (CLARITIN) 10 MG tablet Take 10 mg by mouth as needed for allergies.     metoprolol tartrate (LOPRESSOR) 25 MG tablet Take 0.5 tablets (12.5 mg total) by mouth 2 (two) times daily as needed (for tachycardia or palpitations). 30 tablet 2   triamcinolone cream (KENALOG) 0.1 % Apply twice daily to affected areas until clear 15 g 0   zinc gluconate 50 MG tablet Take 50 mg by mouth daily.     No facility-administered medications prior to visit.    Review of Systems  Patient denies headache, fevers, malaise, unintentional weight loss, skin rash, eye pain, sinus congestion and sinus pain, sore throat, dysphagia,  hemoptysis , cough, dyspnea, wheezing, chest pain, palpitations, orthopnea, edema, abdominal pain, nausea, melena, diarrhea, constipation, flank pain, dysuria, hematuria, urinary  Frequency, nocturia, numbness, tingling, seizures,  Focal weakness, Loss of consciousness,  Tremor, insomnia, depression, anxiety, and suicidal ideation.    Objective:  BP 110/68   Pulse 88   Temp (!) 96.6 F (35.9 C)   Ht 5' 5.98" (1.676 m)   Wt 132 lb 6.4 oz (60.1 kg)   SpO2 97%   BMI 21.38 kg/m   Physical Exam  General appearance: alert, cooperative and appears stated age Head: Normocephalic, without obvious abnormality, atraumatic Eyes: conjunctivae/corneas clear. PERRL, EOM's intact. Fundi benign. Ears: normal TM's and external ear canals both ears Nose: Nares normal. Septum midline. Mucosa normal. No  drainage or sinus tenderness. Throat: lips, mucosa, and tongue normal; teeth and gums normal Neck: no adenopathy, no carotid bruit, no JVD, supple, symmetrical, trachea midline and thyroid not enlarged, symmetric, no tenderness/mass/nodules Lungs: clear to auscultation bilaterally Breasts: normal appearance, no masses or tenderness Chest wall:  pectus excavatum involving the right side  Heart: regular rate and rhythm, S1, S2 normal, no murmur, click, rub or gallop Abdomen: soft, non-tender; bowel sounds normal; no masses,  no organomegaly Extremities: extremities normal, atraumatic, no cyanosis or edema Pulses: 2+ and symmetric Skin: Skin color, texture, turgor normal. No rashes or lesions Neurologic: Alert and oriented X 3, normal strength and tone. Normal symmetric reflexes. Normal coordination and gait.     Assessment & Plan:   Problem List Items Addressed This Visit       Unprioritized   Vitamin D deficiency   Relevant Orders   TSH (Completed)   VITAMIN D 25 Hydroxy (Vit-D Deficiency, Fractures) (Completed)   Depression    Seasonal mostly.  Not on meds currently will resume prozac if symptoms worsen.       Relevant Medications   buPROPion (WELLBUTRIN XL) 150 MG 24 hr tablet   Chronic right shoulder pain    She has declined shoulder replacement of the right shoulder;  Due to this being a  Workmen's Comp case,  The orthopedic surgeon at [facility] has declined to see her       Relevant Medications   buPROPion (WELLBUTRIN XL) 150 MG 24 hr tablet   Congenital pectus excavatum    Her condition has kept her from getting annual mammograms because of the lack of sensitivity.  However, some form of screening needs to be done,  And she has declined MRI breast and CT is not considered (per Dr Bary Castilla) as appropriate .       Unintentional weight loss of 10% body weight within 6 months    Weight loss has resolved and she has regained weight.       Encounter for preventive health examination     age appropriate education and counseling updated, referrals for preventative services and immunizations addressed, dietary and smoking counseling addressed, most recent labs reviewed.  I have personally reviewed and have noted:   1) the patient's medical and social history 2) The pt's use of alcohol, tobacco, and illicit drugs 3) The patient's current medications and supplements 4) Functional ability including ADL's, fall risk, home safety risk, hearing and visual impairment 5) Diet and physical activities 6) Evidence for depression or mood disorder 7) The patient's height, weight, and BMI have been recorded in the chart   I have made referrals, and provided counseling and education based on review of the above      Menopause    Suggested by last year's hormone levels.  She is amenorrheic with an IUD.  Repeat hormone levels are again confirmatory of menopause.       Family history of breast cancer in female    She has not had annual screening since 2018 cue to pectus excavatum.       Relevant Orders   MM 3D SCREEN BREAST BILATERAL   History of PSVT (paroxysmal supraventricular tachycardia)   Relevant Orders   Lipid panel (Completed)   Other Visit Diagnoses     Colon cancer screening    -  Primary   Relevant Orders   Ambulatory referral to Gastroenterology   Amenorrhea       Relevant Orders   Follicle stimulating hormone (Completed)   LH (Completed)   TSH (Completed)   Polycythemia       Relevant Orders   CBC with Differential/Platelet (Completed)   Comprehensive metabolic panel (Completed)       I am having Sarah Pratt start on buPROPion. I am also having her maintain her levonorgestrel, fluticasone, Cholecalciferol, metoprolol tartrate, FLUoxetine, loratadine, zinc gluconate, vitamin C, and triamcinolone cream.  Meds ordered this encounter  Medications   buPROPion (WELLBUTRIN XL) 150 MG 24 hr tablet    Sig: Take 1 tablet (150 mg total) by mouth daily.     Dispense:  30 tablet    Refill:  2    There are no discontinued medications.  Follow-up: Return in about 1 year (around 02/19/2022).   [name], MD

## 2021-02-20 LAB — COMPREHENSIVE METABOLIC PANEL
AG Ratio: 2 (calc) (ref 1.0–2.5)
ALT: 15 U/L (ref 6–29)
AST: 16 U/L (ref 10–35)
Albumin: 4.5 g/dL (ref 3.6–5.1)
Alkaline phosphatase (APISO): 66 U/L (ref 31–125)
BUN: 15 mg/dL (ref 7–25)
CO2: 29 mmol/L (ref 20–32)
Calcium: 9.4 mg/dL (ref 8.6–10.2)
Chloride: 104 mmol/L (ref 98–110)
Creat: 0.62 mg/dL (ref 0.50–0.99)
Globulin: 2.3 g/dL (calc) (ref 1.9–3.7)
Glucose, Bld: 95 mg/dL (ref 65–99)
Potassium: 4.1 mmol/L (ref 3.5–5.3)
Sodium: 140 mmol/L (ref 135–146)
Total Bilirubin: 1 mg/dL (ref 0.2–1.2)
Total Protein: 6.8 g/dL (ref 6.1–8.1)

## 2021-02-20 LAB — CBC WITH DIFFERENTIAL/PLATELET
Absolute Monocytes: 481 cells/uL (ref 200–950)
Basophils Absolute: 32 cells/uL (ref 0–200)
Basophils Relative: 0.6 %
Eosinophils Absolute: 140 cells/uL (ref 15–500)
Eosinophils Relative: 2.6 %
HCT: 43.9 % (ref 35.0–45.0)
Hemoglobin: 15.3 g/dL (ref 11.7–15.5)
Lymphs Abs: 1393 cells/uL (ref 850–3900)
MCH: 31.5 pg (ref 27.0–33.0)
MCHC: 34.9 g/dL (ref 32.0–36.0)
MCV: 90.5 fL (ref 80.0–100.0)
MPV: 11.4 fL (ref 7.5–12.5)
Monocytes Relative: 8.9 %
Neutro Abs: 3353 cells/uL (ref 1500–7800)
Neutrophils Relative %: 62.1 %
Platelets: 194 10*3/uL (ref 140–400)
RBC: 4.85 10*6/uL (ref 3.80–5.10)
RDW: 12.1 % (ref 11.0–15.0)
Total Lymphocyte: 25.8 %
WBC: 5.4 10*3/uL (ref 3.8–10.8)

## 2021-02-20 LAB — TSH: TSH: 1.38 mIU/L

## 2021-02-20 LAB — LIPID PANEL
Cholesterol: 177 mg/dL (ref <200)
HDL: 61 mg/dL (ref >=50)
LDL Cholesterol (Calc): 97 mg/dL (calc)
Non-HDL Cholesterol (Calc): 116 mg/dL (calc) (ref <130)
Total CHOL/HDL Ratio: 2.9 (calc) (ref <5.0)
Triglycerides: 92 mg/dL (ref <150)

## 2021-02-20 LAB — LUTEINIZING HORMONE: LH: 48.8 m[IU]/mL

## 2021-02-20 LAB — VITAMIN D 25 HYDROXY (VIT D DEFICIENCY, FRACTURES): Vit D, 25-Hydroxy: 34 ng/mL (ref 30–100)

## 2021-02-20 LAB — FOLLICLE STIMULATING HORMONE: FSH: 165.5 m[IU]/mL — ABNORMAL HIGH

## 2021-02-21 NOTE — Assessment & Plan Note (Signed)
Her condition has kept her from getting annual mammograms because of the lack of sensitivity.  However, some form of screening needs to be done,  And she has declined MRI breast and CT is not considered (per Dr Bary Castilla) as appropriate .

## 2021-02-21 NOTE — Assessment & Plan Note (Signed)
She has declined shoulder replacement of the right shoulder;  Due to this being a Workmen's Comp case,  The Doctor, general practice at [facility] has declined to see her

## 2021-02-21 NOTE — Assessment & Plan Note (Signed)

## 2021-02-21 NOTE — Assessment & Plan Note (Addendum)
Suggested by last year's hormone levels.  She is amenorrheic with an IUD.  Repeat hormone levels are again confirmatory of menopause.

## 2021-02-21 NOTE — Assessment & Plan Note (Signed)
Weight loss has resolved and she has regained weight.

## 2021-03-17 ENCOUNTER — Other Ambulatory Visit: Payer: Self-pay

## 2021-03-17 DIAGNOSIS — Z1211 Encounter for screening for malignant neoplasm of colon: Secondary | ICD-10-CM

## 2021-03-17 DIAGNOSIS — Z800 Family history of malignant neoplasm of digestive organs: Secondary | ICD-10-CM

## 2021-03-17 MED ORDER — NA SULFATE-K SULFATE-MG SULF 17.5-3.13-1.6 GM/177ML PO SOLN
1.0000 | Freq: Once | ORAL | 0 refills | Status: AC
  Filled 2021-03-17 – 2021-03-30 (×3): qty 354, 1d supply, fill #0

## 2021-03-17 NOTE — Progress Notes (Signed)
Gastroenterology Pre-Procedure Review  Request Date: 04/22/21 Requesting Physician: Dr. Marius Ditch  PATIENT REVIEW QUESTIONS: The patient responded to the following health history questions as indicated:    1. Are you having any GI issues? no 2. Do you have a personal history of Polyps? no 3. Do you have a family history of Colon Cancer or Polyps? yes (Maternal Grandmother- colon cancer) 4. Diabetes Mellitus? no 5. Joint replacements in the past 12 months?no 6. Major health problems in the past 3 months?no 7. Any artificial heart valves, MVP, or defibrillator?no    MEDICATIONS & ALLERGIES:    Patient reports the following regarding taking any anticoagulation/antiplatelet therapy:   Plavix, Coumadin, Eliquis, Xarelto, Lovenox, Pradaxa, Brilinta, or Effient? no Aspirin? no  Patient confirms/reports the following medications:  Current Outpatient Medications  Medication Sig Dispense Refill   Ascorbic Acid (VITAMIN C) 1000 MG tablet Take 1,000 mg by mouth daily.     buPROPion (WELLBUTRIN XL) 150 MG 24 hr tablet Take 1 tablet (150 mg total) by mouth daily. 30 tablet 2   Cholecalciferol 25 MCG (1000 UT) capsule Take 1,000 Units by mouth daily.     FLUoxetine (PROZAC) 10 MG capsule TAKE 1 CAPSULE BY MOUTH DAILY. 90 capsule 0   fluticasone (FLONASE) 50 MCG/ACT nasal spray Place 2 sprays into both nostrils as needed.      levonorgestrel (MIRENA) 20 MCG/24HR IUD by Intrauterine route.      loratadine (CLARITIN) 10 MG tablet Take 10 mg by mouth as needed for allergies.     metoprolol tartrate (LOPRESSOR) 25 MG tablet Take 0.5 tablets (12.5 mg total) by mouth 2 (two) times daily as needed (for tachycardia or palpitations). 30 tablet 2   triamcinolone cream (KENALOG) 0.1 % Apply twice daily to affected areas until clear 15 g 0   zinc gluconate 50 MG tablet Take 50 mg by mouth daily.     No current facility-administered medications for this visit.    Patient confirms/reports the following allergies:   No Known Allergies  No orders of the defined types were placed in this encounter.   AUTHORIZATION INFORMATION Primary Insurance: 1D#: Group #:  Secondary Insurance: 1D#: Group #:  SCHEDULE INFORMATION: Date: 04/22/21 Time: Location: [place]

## 2021-03-29 ENCOUNTER — Other Ambulatory Visit: Payer: Self-pay

## 2021-03-30 ENCOUNTER — Other Ambulatory Visit: Payer: Self-pay

## 2021-04-14 ENCOUNTER — Encounter: Payer: Self-pay | Admitting: Gastroenterology

## 2021-04-22 ENCOUNTER — Ambulatory Visit: Payer: 59 | Admitting: Anesthesiology

## 2021-04-22 ENCOUNTER — Other Ambulatory Visit: Payer: Self-pay

## 2021-04-22 ENCOUNTER — Encounter: Payer: Self-pay | Admitting: Gastroenterology

## 2021-04-22 ENCOUNTER — Encounter
Admission: RE | Disposition: A | Discharge status: Home / Self Care | Payer: Self-pay | Source: Home / Self Care | Attending: Gastroenterology

## 2021-04-22 ENCOUNTER — Ambulatory Visit
Admission: RE | Admit: 2021-04-22 | Discharge: 2021-04-22 | Disposition: A | Discharge status: Home / Self Care | Payer: 59 | Attending: Gastroenterology | Admitting: Gastroenterology

## 2021-04-22 DIAGNOSIS — I341 Nonrheumatic mitral (valve) prolapse: Secondary | ICD-10-CM | POA: Insufficient documentation

## 2021-04-22 DIAGNOSIS — Z1211 Encounter for screening for malignant neoplasm of colon: Secondary | ICD-10-CM | POA: Insufficient documentation

## 2021-04-22 DIAGNOSIS — M1990 Unspecified osteoarthritis, unspecified site: Secondary | ICD-10-CM | POA: Diagnosis not present

## 2021-04-22 DIAGNOSIS — R0600 Dyspnea, unspecified: Secondary | ICD-10-CM | POA: Diagnosis not present

## 2021-04-22 DIAGNOSIS — Z800 Family history of malignant neoplasm of digestive organs: Secondary | ICD-10-CM

## 2021-04-22 HISTORY — PX: COLONOSCOPY WITH PROPOFOL: SHX5780

## 2021-04-22 SURGERY — COLONOSCOPY WITH PROPOFOL
Anesthesia: General | Site: Rectum

## 2021-04-22 MED ORDER — ACETAMINOPHEN 160 MG/5ML PO SOLN: 975.0000 mg | Freq: Once | ORAL | Status: DC | PRN

## 2021-04-22 MED ORDER — ONDANSETRON HCL 4 MG/2ML IJ SOLN: 4.0000 mg | Freq: Once | INTRAMUSCULAR | Status: DC | PRN

## 2021-04-22 MED ORDER — LACTATED RINGERS IV SOLN: INTRAVENOUS | Status: DC

## 2021-04-22 MED ORDER — ACETAMINOPHEN 500 MG PO TABS: 1000.0000 mg | ORAL_TABLET | Freq: Once | ORAL | Status: DC | PRN

## 2021-04-22 MED ORDER — STERILE WATER FOR IRRIGATION IR SOLN
Status: DC | PRN
  Administered 2021-04-22: 1

## 2021-04-22 MED ORDER — PROPOFOL 10 MG/ML IV BOLUS
INTRAVENOUS | Status: DC | PRN
  Administered 2021-04-22: 50 mg via INTRAVENOUS
  Administered 2021-04-22: 150 mg via INTRAVENOUS
  Administered 2021-04-22: 30 mg via INTRAVENOUS

## 2021-04-22 MED ORDER — LIDOCAINE HCL (CARDIAC) PF 100 MG/5ML IV SOSY
PREFILLED_SYRINGE | INTRAVENOUS | Status: DC | PRN
  Administered 2021-04-22: 30 mg via INTRAVENOUS

## 2021-04-22 MED ORDER — SODIUM CHLORIDE 0.9 % IV SOLN: INTRAVENOUS | Status: DC

## 2021-04-22 SURGICAL SUPPLY — 6 items
GOWN CVR UNV OPN BCK APRN NK (MISCELLANEOUS) ×2 IMPLANT
GOWN ISOL THUMB LOOP REG UNIV (MISCELLANEOUS) ×4
KIT PRC NS LF DISP ENDO (KITS) ×1 IMPLANT
KIT PROCEDURE OLYMPUS (KITS) ×2
MANIFOLD NEPTUNE II (INSTRUMENTS) ×2 IMPLANT
WATER STERILE IRR 250ML POUR (IV SOLUTION) ×2 IMPLANT

## 2021-04-22 NOTE — Anesthesia Preprocedure Evaluation (Addendum)
Anesthesia Evaluation  Patient identified by MRN, date of birth, ID band Patient awake    Reviewed: Allergy & Precautions, H&P , NPO status , Patient's Chart, lab work & pertinent test results, reviewed documented beta blocker date and time   Airway Mallampati: II  TM Distance: >3 FB Neck ROM: full    Dental no notable dental hx.    Pulmonary neg pulmonary ROS,    Pulmonary exam normal breath sounds clear to auscultation       Cardiovascular Exercise Tolerance: Good + DOE  + Valvular Problems/Murmurs (MVP)  Rhythm:regular Rate:Normal  PSVT/?POTS - Has tachycardia a few times a week, exacerbated by low sleep / low fluid intake  12/2019 TTE - 1. Left ventricular ejection fraction, by estimation, is 60 to 65%. The  left ventricle has normal function. The left ventricle has no regional  wall motion abnormalities. Left ventricular diastolic parameters were  normal.  2. Right ventricular systolic function is normal. The right ventricular  size is normal. There is normal pulmonary artery systolic pressure. The  estimated right ventricular systolic pressure is 20.2 mmHg.  3. The mitral valve is normal in structure. Mild prolapse of the anterior  leaflet, Trivial mitral valve regurgitation. No evidence of mitral  stenosis.    Neuro/Psych negative neurological ROS  negative psych ROS   GI/Hepatic negative GI ROS, Neg liver ROS,   Endo/Other  negative endocrine ROS  Renal/GU negative Renal ROS  negative genitourinary   Musculoskeletal  (+) Arthritis ,   Abdominal   Peds  Hematology negative hematology ROS (+)   Anesthesia Other Findings   Reproductive/Obstetrics negative OB ROS                            Anesthesia Physical Anesthesia Plan  ASA: 2  Anesthesia Plan: General   Post-op Pain Management:    Induction:   PONV Risk Score and Plan:   Airway Management Planned:   Additional  Equipment:   Intra-op Plan:   Post-operative Plan:   Informed Consent: I have reviewed the patients History and Physical, chart, labs and discussed the procedure including the risks, benefits and alternatives for the proposed anesthesia with the patient or authorized representative who has indicated his/her understanding and acceptance.     Dental Advisory Given  Plan Discussed with: CRNA  Anesthesia Plan Comments:         Anesthesia Quick Evaluation

## 2021-04-22 NOTE — Anesthesia Postprocedure Evaluation (Signed)
Anesthesia Post Note  Patient: Sarah Pratt  Procedure(s) Performed: COLONOSCOPY WITH PROPOFOL (Rectum)     Patient location during evaluation: PACU Anesthesia Type: General Level of consciousness: awake and alert Pain management: pain level controlled Vital Signs Assessment: post-procedure vital signs reviewed and stable Respiratory status: spontaneous breathing, nonlabored ventilation and respiratory function stable Cardiovascular status: blood pressure returned to baseline and stable Postop Assessment: no apparent nausea or vomiting Anesthetic complications: no   No notable events documented.  April Manson

## 2021-04-22 NOTE — H&P (Signed)
Cephas Darby, MD [address]  [place]  [place], [place] [address]  Main: [phone]  Fax: [phone] Pager: [phone]  Primary Care Physician:  [name], MD Primary Gastroenterologist:  Dr. Cephas Darby  Pre-Procedure History & Physical: HPI:  Sarah Pratt is a 45 y.o. female is here for an colonoscopy.   Past Medical History:  Diagnosis Date   Allergy    Arthritis    Asthma    Chest pain    a. 07/2017 St Echo: EF 65-70%, no rwma. No wall motion abnormalities w/ exercise. Nl study.   Chicken pox    Congenital pectus excavatum    Depression    Dyspnea on exertion    Mitral valve prolapse    a. 10/2016 Echo: EF 55-60%, no rwma, mild MVP w/ triv MR, nl RV fxn.   PSVT (paroxysmal supraventricular tachycardia) ([place])    a. Prev eval @ [facility] - wishes to avoid RFCA @ this time.    Past Surgical History:  Procedure Laterality Date   FRACTURE SURGERY Right 08/1998   shoulder fracture   SHOULDER SURGERY Right 08/1998, 02/1999, 09/2013    Prior to Admission medications   Medication Sig Start Date End Date Taking? Authorizing Provider  Ascorbic Acid (VITAMIN C) 1000 MG tablet Take 1,000 mg by mouth daily.   Yes [provider]  buPROPion (WELLBUTRIN XL) 150 MG 24 hr tablet Take 1 tablet (150 mg total) by mouth daily. 02/19/21  Yes [name], MD  Cholecalciferol 25 MCG (1000 UT) capsule Take 1,000 Units by mouth daily.   Yes [provider]  fluticasone (FLONASE) 50 MCG/ACT nasal spray Place 2 sprays into both nostrils as needed.  07/16/15  Yes [provider]  levonorgestrel (MIRENA) 20 MCG/24HR IUD by Intrauterine route.  04/16/13  Yes [provider]  loratadine (CLARITIN) 10 MG tablet Take 10 mg by mouth as needed for allergies.   Yes [provider]  triamcinolone cream (KENALOG) 0.1 % Apply twice daily to affected areas until clear 01/07/21  Yes   zinc gluconate 50 MG tablet Take 50 mg by mouth daily.   Yes  [provider]  FLUoxetine (PROZAC) 10 MG capsule TAKE 1 CAPSULE BY MOUTH DAILY. Patient not taking: Reported on 04/14/2021 01/07/20   [name], MD  metoprolol tartrate (LOPRESSOR) 25 MG tablet Take 0.5 tablets (12.5 mg total) by mouth 2 (two) times daily as needed (for tachycardia or palpitations). Patient not taking: Reported on 04/22/2021 01/07/20 04/14/21  Loel Dubonnet, NP    Allergies as of 03/17/2021   (No Known Allergies)    Family History  Problem Relation Age of Onset   Hyperlipidemia Mother    Diabetes Mother 59       late type 2   Atrial fibrillation Mother    Arthritis Father        rheumatoid    Hyperlipidemia Father    Parkinson's disease Father 71       deceased   Multiple sclerosis Sister 35   Rheum arthritis Sister 26   Breast cancer Maternal Aunt        ? 50's   Colon cancer Maternal Grandmother        ? 75's and bladder cancer   Von Willebrand disease Son     Social History   Socioeconomic History   Marital status: Married    Spouse name: Not on file   Number of children: Not on file  Years of education: Not on file   Highest education level: Not on file  Occupational History   Not on file  Tobacco Use   Smoking status: Never   Smokeless tobacco: Never  Vaping Use   Vaping Use: Never used  Substance and Sexual Activity   Alcohol use: No    Alcohol/week: 0.0 standard drinks   Drug use: No   Sexual activity: Yes    Partners: Male  Other Topics Concern   Not on file  Social History Narrative   Not on file   Social Determinants of Health   Financial Resource Strain: Not on file  Food Insecurity: Not on file  Transportation Needs: Not on file  Physical Activity: Not on file  Stress: Not on file  Social Connections: Not on file  Intimate Partner Violence: Not on file    Review of Systems: See HPI, otherwise negative ROS  Physical Exam: BP 105/78    Pulse 82    Temp 97.7 F (36.5 C) (Temporal)    Resp 18    Ht 5'  6" (1.676 m)    Wt 55.8 kg    SpO2 100%    BMI 19.85 kg/m  General:   Alert,  pleasant and cooperative in NAD Head:  Normocephalic and atraumatic. Neck:  Supple; no masses or thyromegaly. Lungs:  Clear throughout to auscultation.    Heart:  Regular rate and rhythm. Abdomen:  Soft, nontender and nondistended. Normal bowel sounds, without guarding, and without rebound.   Neurologic:  Alert and  oriented x4;  grossly normal neurologically.  Impression/Plan: Sarah Pratt is here for an colonoscopy to be performed for colon cancer screening  Risks, benefits, limitations, and alternatives regarding  colonoscopy have been reviewed with the patient.  Questions have been answered.  All parties agreeable.   Sherri Sear, MD  04/22/2021, 10:03 AM

## 2021-04-22 NOTE — Op Note (Signed)
[facility] Gastroenterology Patient Name: Sarah Pratt Procedure Date: 04/22/2021 10:40 AM MRN: [id number] Account #: [id number] Date of Birth: 16-Aug-1975 Admit Type: Outpatient Age: 45 Room: [facility] OR ROOM 01 Gender: Female Note Status: Finalized Instrument Name: [id number] Procedure:             Colonoscopy Indications:           Screening for colorectal malignant neoplasm Providers:             Lin Landsman MD, MD Referring MD:          Deborra Medina, MD (Referring MD) Medicines:             General Anesthesia Complications:         No immediate complications. Estimated blood loss: None. Procedure:             Pre-Anesthesia Assessment:                        - Prior to the procedure, a History and Physical was                         performed, and patient medications and allergies were                         reviewed. The patient is competent. The risks and                         benefits of the procedure and the sedation options and                         risks were discussed with the patient. All questions                         were answered and informed consent was obtained.                         Patient identification and proposed procedure were                         verified by the physician, the nurse, the                         anesthesiologist, the anesthetist and the technician                         in the pre-procedure area in the procedure room in the                         endoscopy suite. Mental Status Examination: alert and                         oriented. Airway Examination: normal oropharyngeal                         airway and neck mobility. Respiratory Examination:                         clear to auscultation. CV Examination: normal.  Prophylactic Antibiotics: The patient does not require                         prophylactic antibiotics. Prior Anticoagulants: The                         patient has  taken no previous anticoagulant or                         antiplatelet agents. ASA Grade Assessment: II - A                         patient with mild systemic disease. After reviewing                         the risks and benefits, the patient was deemed in                         satisfactory condition to undergo the procedure. The                         anesthesia plan was to use general anesthesia.                         Immediately prior to administration of medications,                         the patient was re-assessed for adequacy to receive                         sedatives. The heart rate, respiratory rate, oxygen                         saturations, blood pressure, adequacy of pulmonary                         ventilation, and response to care were monitored                         throughout the procedure. The physical status of the                         patient was re-assessed after the procedure.                        After obtaining informed consent, the colonoscope was                         passed under direct vision. Throughout the procedure,                         the patient's blood pressure, pulse, and oxygen                         saturations were monitored continuously. The                         Colonoscope was introduced through the anus and  advanced to the the terminal ileum, with                         identification of the appendiceal orifice and IC                         valve. The colonoscopy was performed without                         difficulty. The patient tolerated the procedure well.                         The quality of the bowel preparation was evaluated                         using the BBPS [facility] Bowel Preparation Scale) with                         scores of: Right Colon = 3, Transverse Colon = 3 and                         Left Colon = 3 (entire mucosa seen well with no                         residual  staining, small fragments of stool or opaque                         liquid). The total BBPS score equals 9. Findings:      The perianal and digital rectal examinations were normal. Pertinent       negatives include normal sphincter tone and no palpable rectal lesions.      The terminal ileum appeared normal.      The entire examined colon appeared normal.      The retroflexed view of the distal rectum and anal verge was normal and       showed no anal or rectal abnormalities. Impression:            - The examined portion of the ileum was normal.                        - The entire examined colon is normal.                        - The distal rectum and anal verge are normal on                         retroflexion view.                        - No specimens collected. Recommendation:        - Discharge patient to home (with escort).                        - Resume regular diet today.                        - Continue present medications.                        -  Repeat colonoscopy in 10 years for screening                         purposes. Procedure Code(s):     --- Professional ---                        U1103, Colorectal cancer screening; colonoscopy on                         individual not meeting criteria for high risk Diagnosis Code(s):     --- Professional ---                        Z12.11, Encounter for screening for malignant neoplasm                         of colon CPT copyright 2019 American Medical Association. All rights reserved. The codes documented in this report are preliminary and upon coder review may  be revised to meet current compliance requirements. Dr. Ulyess Mort Lin Landsman MD, MD 04/22/2021 11:04:32 AM This report has been signed electronically. Number of Addenda: 0 Note Initiated On: 04/22/2021 10:40 AM Scope Withdrawal Time: 0 hours 9 minutes 32 seconds  Total Procedure Duration: 0 hours 12 minutes 23 seconds  Estimated Blood Loss:  Estimated  blood loss: none.      [facility]

## 2021-04-22 NOTE — Transfer of Care (Signed)
Immediate Anesthesia Transfer of Care Note  Patient: Sarah Pratt  Procedure(s) Performed: COLONOSCOPY WITH PROPOFOL (Rectum)  Patient Location: PACU  Anesthesia Type: General  Level of Consciousness: awake, alert  and patient cooperative  Airway and Oxygen Therapy: Patient Spontanous Breathing and Patient connected to supplemental oxygen  Post-op Assessment: Post-op Vital signs reviewed, Patient's Cardiovascular Status Stable, Respiratory Function Stable, Patent Airway and No signs of Nausea or vomiting  Post-op Vital Signs: Reviewed and stable  Complications: No notable events documented.

## 2021-04-23 ENCOUNTER — Encounter: Payer: Self-pay | Admitting: Gastroenterology

## 2021-05-03 ENCOUNTER — Encounter: Payer: Self-pay | Admitting: Internal Medicine

## 2021-05-26 ENCOUNTER — Other Ambulatory Visit: Payer: Self-pay

## 2021-05-26 ENCOUNTER — Ambulatory Visit
Admission: RE | Admit: 2021-05-26 | Discharge: 2021-05-26 | Disposition: A | Discharge status: Home / Self Care | Payer: 59 | Source: Ambulatory Visit | Attending: Internal Medicine | Admitting: Internal Medicine

## 2021-05-26 DIAGNOSIS — Z1231 Encounter for screening mammogram for malignant neoplasm of breast: Secondary | ICD-10-CM | POA: Insufficient documentation

## 2021-05-26 DIAGNOSIS — Z803 Family history of malignant neoplasm of breast: Secondary | ICD-10-CM | POA: Insufficient documentation

## 2021-06-02 ENCOUNTER — Other Ambulatory Visit: Payer: Self-pay

## 2021-06-14 DIAGNOSIS — M25512 Pain in left shoulder: Secondary | ICD-10-CM | POA: Diagnosis not present

## 2021-06-14 DIAGNOSIS — M7502 Adhesive capsulitis of left shoulder: Secondary | ICD-10-CM | POA: Insufficient documentation

## 2021-06-23 DIAGNOSIS — R293 Abnormal posture: Secondary | ICD-10-CM | POA: Diagnosis not present

## 2021-06-23 DIAGNOSIS — M25512 Pain in left shoulder: Secondary | ICD-10-CM | POA: Diagnosis not present

## 2021-06-23 DIAGNOSIS — M7502 Adhesive capsulitis of left shoulder: Secondary | ICD-10-CM | POA: Diagnosis not present

## 2021-06-29 DIAGNOSIS — H524 Presbyopia: Secondary | ICD-10-CM | POA: Diagnosis not present

## 2021-06-29 DIAGNOSIS — H5203 Hypermetropia, bilateral: Secondary | ICD-10-CM | POA: Diagnosis not present

## 2021-06-29 DIAGNOSIS — H52221 Regular astigmatism, right eye: Secondary | ICD-10-CM | POA: Diagnosis not present

## 2021-06-30 DIAGNOSIS — R293 Abnormal posture: Secondary | ICD-10-CM | POA: Diagnosis not present

## 2021-06-30 DIAGNOSIS — M7502 Adhesive capsulitis of left shoulder: Secondary | ICD-10-CM | POA: Diagnosis not present

## 2021-06-30 DIAGNOSIS — M25512 Pain in left shoulder: Secondary | ICD-10-CM | POA: Diagnosis not present

## 2021-07-02 DIAGNOSIS — M25512 Pain in left shoulder: Secondary | ICD-10-CM | POA: Diagnosis not present

## 2021-07-02 DIAGNOSIS — R293 Abnormal posture: Secondary | ICD-10-CM | POA: Diagnosis not present

## 2021-07-02 DIAGNOSIS — M7502 Adhesive capsulitis of left shoulder: Secondary | ICD-10-CM | POA: Diagnosis not present

## 2021-07-05 DIAGNOSIS — M25512 Pain in left shoulder: Secondary | ICD-10-CM | POA: Diagnosis not present

## 2021-07-05 DIAGNOSIS — R293 Abnormal posture: Secondary | ICD-10-CM | POA: Diagnosis not present

## 2021-07-05 DIAGNOSIS — M7502 Adhesive capsulitis of left shoulder: Secondary | ICD-10-CM | POA: Diagnosis not present

## 2021-07-08 DIAGNOSIS — M7502 Adhesive capsulitis of left shoulder: Secondary | ICD-10-CM | POA: Diagnosis not present

## 2021-07-08 DIAGNOSIS — M25512 Pain in left shoulder: Secondary | ICD-10-CM | POA: Diagnosis not present

## 2021-07-08 DIAGNOSIS — R293 Abnormal posture: Secondary | ICD-10-CM | POA: Diagnosis not present

## 2021-07-19 DIAGNOSIS — R293 Abnormal posture: Secondary | ICD-10-CM | POA: Diagnosis not present

## 2021-07-19 DIAGNOSIS — M25512 Pain in left shoulder: Secondary | ICD-10-CM | POA: Diagnosis not present

## 2021-07-19 DIAGNOSIS — M7502 Adhesive capsulitis of left shoulder: Secondary | ICD-10-CM | POA: Diagnosis not present

## 2021-07-21 DIAGNOSIS — R293 Abnormal posture: Secondary | ICD-10-CM | POA: Diagnosis not present

## 2021-07-21 DIAGNOSIS — M25512 Pain in left shoulder: Secondary | ICD-10-CM | POA: Diagnosis not present

## 2021-07-21 DIAGNOSIS — M7502 Adhesive capsulitis of left shoulder: Secondary | ICD-10-CM | POA: Diagnosis not present

## 2021-07-23 DIAGNOSIS — M7502 Adhesive capsulitis of left shoulder: Secondary | ICD-10-CM | POA: Diagnosis not present

## 2021-08-25 ENCOUNTER — Encounter: Payer: Self-pay | Admitting: Cardiology

## 2021-08-25 ENCOUNTER — Ambulatory Visit: Payer: 59 | Admitting: Cardiology

## 2021-08-25 VITALS — BP 98/68 | HR 62 | Ht 66.0 in | Wt 137.0 lb

## 2021-08-25 DIAGNOSIS — I471 Supraventricular tachycardia: Secondary | ICD-10-CM | POA: Diagnosis not present

## 2021-08-25 DIAGNOSIS — R000 Tachycardia, unspecified: Secondary | ICD-10-CM | POA: Diagnosis not present

## 2021-08-25 NOTE — Patient Instructions (Addendum)
Medications: ?Your physician recommends that you continue on your current medications as directed. Please refer to the Current Medication list given to you today. ?*If you need a refill on your cardiac medications before your next appointment, please call your pharmacy* ? ?Lab Work: ?None. ?If you have labs (blood work) drawn today and your tests are completely normal, you will receive your results only by: ?MyChart Message (if you have MyChart) OR ?A paper copy in the mail ?If you have any lab test that is abnormal or we need to change your treatment, we will call you to review the results. ? ?Testing/Procedures: ?None. ? ?Follow-Up: ?At [facility], you and your health needs are our priority.  As part of our continuing mission to provide you with exceptional heart care, we have created designated Provider Care Teams.  These Care Teams include your primary Cardiologist (physician) and Advanced Practice Providers (APPs -  Physician Assistants and Nurse Practitioners) who all work together to provide you with the care you need, when you need it. ? ?Your physician wants you to follow-up in: As needed with Lars Mage  ? ?We recommend signing up for the patient portal called "MyChart".  Sign up information is provided on this After Visit Summary.  MyChart is used to connect with patients for Virtual Visits (Telemedicine).  Patients are able to view lab/test results, encounter notes, upcoming appointments, etc.  Non-urgent messages can be sent to your provider as well.   ?To learn more about what you can do with MyChart, go to [email or web address].   ? ?Any Other Special Instructions Will Be Listed Below (If Applicable).  ?

## 2021-08-25 NOTE — Progress Notes (Signed)
?Electrophysiology Office Follow up Visit Note:   ? ?Date:  08/25/2021  ? ?ID:  Sarah Pratt, DOB 1976-01-27, MRN [id number] ? ?PCP:  [name], MD  ?[facility] Cardiologist:  Kathlyn Sacramento, MD  ?[facility] Electrophysiologist:  Vickie Epley, MD  ? ? ?Interval History:   ? ?Sarah Pratt is a 46 y.o. female who presents for a follow up visit. They were last seen in clinic April 15, 2020 for paroxysmal SVT.  At the last appointment I suspected her arrhythmia was secondary to intermittent sinus tachycardia.  There was no significant mitral regurgitation on echo.  We recommended follow-up in 1 year. ? ?She is doing well today without new complaint.  She is concerned about her weight trending up which she thinks may be related to menopause.  She is watching what she eats. ? ?  ? ?Past Medical History:  ?Diagnosis Date  ? Allergy   ? Arthritis   ? Asthma   ? Chest pain   ? a. 07/2017 St Echo: EF 65-70%, no rwma. No wall motion abnormalities w/ exercise. Nl study.  ? Chicken pox   ? Congenital pectus excavatum   ? Depression   ? Dyspnea on exertion   ? Mitral valve prolapse   ? a. 10/2016 Echo: EF 55-60%, no rwma, mild MVP w/ triv MR, nl RV fxn.  ? PSVT (paroxysmal supraventricular tachycardia) ([place])   ? a. Prev eval @ [facility] - wishes to avoid RFCA @ this time.  ? ? ?Past Surgical History:  ?Procedure Laterality Date  ? COLONOSCOPY WITH PROPOFOL N/A 04/22/2021  ? Procedure: COLONOSCOPY WITH PROPOFOL;  Surgeon: Lin Landsman, MD;  Location: [facility];  Service: Endoscopy;  Laterality: N/A;  ? FRACTURE SURGERY Right 08/1998  ? shoulder fracture  ? SHOULDER SURGERY Right 08/1998, 02/1999, 09/2013  ? ? ?Current Medications: ?Current Meds  ?Medication Sig  ? Ascorbic Acid (VITAMIN C) 1000 MG tablet Take 1,000 mg by mouth daily.  ? Cholecalciferol 25 MCG (1000 UT) capsule Take 1,000 Units by mouth daily.  ? fluticasone (FLONASE) 50 MCG/ACT nasal spray Place 2 sprays into both nostrils as  needed.   ? levonorgestrel (MIRENA) 20 MCG/24HR IUD by Intrauterine route.   ? loratadine (CLARITIN) 10 MG tablet Take 10 mg by mouth as needed for allergies.  ? metoprolol tartrate (LOPRESSOR) 25 MG tablet Take by mouth as needed.  ? triamcinolone cream (KENALOG) 0.1 % Apply twice daily to affected areas until clear  ? zinc gluconate 50 MG tablet Take 50 mg by mouth daily.  ?  ? ?Allergies:   Patient has no known allergies.  ? ?Social History  ? ?Socioeconomic History  ? Marital status: Married  ?  Spouse name: Not on file  ? Number of children: Not on file  ? Years of education: Not on file  ? Highest education level: Not on file  ?Occupational History  ? Not on file  ?Tobacco Use  ? Smoking status: Never  ? Smokeless tobacco: Never  ?Vaping Use  ? Vaping Use: Never used  ?Substance and Sexual Activity  ? Alcohol use: No  ?  Alcohol/week: 0.0 standard drinks  ? Drug use: No  ? Sexual activity: Yes  ?  Partners: Male  ?Other Topics Concern  ? Not on file  ?Social History Narrative  ? Not on file  ? ?Social Determinants of Health  ? ?Financial Resource Strain: Not on file  ?Food Insecurity: Not on file  ?Transportation  Needs: Not on file  ?Physical Activity: Not on file  ?Stress: Not on file  ?Social Connections: Not on file  ?  ? ?Family History: ?The patient's family history includes Arthritis in her father; Atrial fibrillation in her mother; Breast cancer in her maternal aunt; Colon cancer in her maternal grandmother; Diabetes (age of onset: 44) in her mother; Hyperlipidemia in her father and mother; Multiple sclerosis (age of onset: 22) in her sister; Parkinson's disease (age of onset: 51) in her father; Rheum arthritis (age of onset: 46) in her sister; Von Willebrand disease in her son. ? ?ROS:   ?Please see the history of present illness.    ?All other systems reviewed and are negative. ? ?EKGs/Labs/Other Studies Reviewed:   ? ?The following studies were reviewed today: ? ? ?EKG:  The ekg ordered today  demonstrates sinus rhythm.  Intervals are normal. ? ?Recent Labs: ?02/19/2021: ALT 15; BUN 15; Creat 0.62; Hemoglobin 15.3; Platelets 194; Potassium 4.1; Sodium 140; TSH 1.38  ?Recent Lipid Panel ?   ?Component Value Date/Time  ? CHOL 177 02/19/2021 1616  ? TRIG 92 02/19/2021 1616  ? HDL 61 02/19/2021 1616  ? CHOLHDL 2.9 02/19/2021 1616  ? VLDL 9.6 10/04/2019 0908  ? [place] 97 02/19/2021 1616  ? ? ?Physical Exam:   ? ?VS:  BP 98/68 (BP Location: Left Arm, Patient Position: Sitting, Cuff Size: Normal)   Pulse 62   Ht '5\' 6"'$  (1.676 m)   Wt 137 lb (62.1 kg)   SpO2 98%   BMI 22.11 kg/m?    ? ?Wt Readings from Last 3 Encounters:  ?08/25/21 137 lb (62.1 kg)  ?04/22/21 123 lb (55.8 kg)  ?02/19/21 132 lb 6.4 oz (60.1 kg)  ?  ? ?GEN:  Well nourished, well developed in no acute distress ?HEENT: Normal ?NECK: No JVD; No carotid bruits ?LYMPHATICS: No lymphadenopathy ?CARDIAC: RRR, no murmurs, rubs, gallops ?RESPIRATORY:  Clear to auscultation without rales, wheezing or rhonchi  ?ABDOMEN: Soft, non-tender, non-distended ?MUSCULOSKELETAL:  No edema; No deformity  ?SKIN: Warm and dry ?NEUROLOGIC:  Alert and oriented x 3 ?PSYCHIATRIC:  Normal affect  ? ? ? ?  ? ?ASSESSMENT:   ? ?1. PSVT (paroxysmal supraventricular tachycardia) ([place])   ?2. Tachycardia   ? ?PLAN:   ? ?In order of problems listed above: ? ?#Tachycardia  ?#Sinus tachycardia ?No new issues.  Her symptoms are well controlled.  EKG normal today.  Blood pressure normal today.  I would recommend continued follow-up with her primary care physician.  She can always reach out to [place] if new issues arise. ? ?We did discuss healthy eating during today's clinic appointment. ? ? ?Total time spent with patient today 20 minutes. This includes reviewing records, evaluating the patient and coordinating care.  ? ?Medication Adjustments/Labs and Tests Ordered: ?Current medicines are reviewed at length with the patient today.  Concerns regarding medicines are outlined above.  ?No  orders of the defined types were placed in this encounter. ? ?No orders of the defined types were placed in this encounter. ? ? ? ?Signed, ?Lars Mage, MD, [facility] [place] ?08/25/2021 3:01 PM    ?Electrophysiology ?[place] ?

## 2021-10-18 ENCOUNTER — Encounter: Payer: Self-pay | Admitting: Internal Medicine

## 2021-10-18 DIAGNOSIS — R300 Dysuria: Secondary | ICD-10-CM

## 2021-10-18 NOTE — Addendum Note (Signed)
Addended by: [name] on: 10/18/2021 01:50 PM   Modules accepted: Orders

## 2021-10-18 NOTE — Telephone Encounter (Signed)
I called patient & she is scheduled for Wednesday at 4:30p for VV.

## 2021-10-18 NOTE — Telephone Encounter (Signed)
Spoke with pt and she is going to [facility] [place] lab to collect her urine. Orders have been placed.

## 2021-10-19 ENCOUNTER — Other Ambulatory Visit: Payer: Self-pay

## 2021-10-19 ENCOUNTER — Telehealth: Payer: Self-pay

## 2021-10-19 MED ORDER — SULFAMETHOXAZOLE-TRIMETHOPRIM 800-160 MG PO TABS
1.0000 | ORAL_TABLET | Freq: Two times a day (BID) | ORAL | 0 refills | Status: DC
  Filled 2021-10-19: qty 10, 5d supply, fill #0

## 2021-10-19 MED ORDER — SULFAMETHOXAZOLE-TRIMETHOPRIM 800-160 MG PO TABS: 1.0000 | ORAL_TABLET | Freq: Two times a day (BID) | ORAL | 0 refills | Status: DC

## 2021-10-19 NOTE — Telephone Encounter (Signed)
Spoke with pt to let her know that medication has been sent. Pt asked that it be resent to [facility] in [place] medication has been resent.

## 2021-10-19 NOTE — Addendum Note (Signed)
Addended by: [name] on: 10/19/2021 04:16 PM   Modules accepted: Orders

## 2021-10-19 NOTE — Telephone Encounter (Signed)
SEPTRA DS SENT TO [facility]

## 2021-10-19 NOTE — Telephone Encounter (Signed)
Possible UTI (Newest Message First) View All Conversations on this Encounter Pratt, Sarah Guiles routed conversation to You 46 minutes ago (3:05 PM)   Pratt, Sarah Guiles 46 minutes ago (3:05 PM)   [place] Patient called about her lab results from yesterday. Patient advised that the labs are not in. She wants to know if she should go some where else, the pain is getting worse. 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      Note    Pratt, Sarah Brunt, CMA Yesterday (2:58 PM)    I called patient & she is scheduled for Wednesday at 4:30p for VV.      Note    You Yesterday (1:50 PM)    Addended by: [name] on: 10/18/2021 01:50 PM     Modules accepted: Orders       Note    Sarah Pratt  P [facility] (supporting [name], MD) Yesterday (12:32 PM)    Thank you so much for the quick call from the nurse!  I appreciate her willingness to help me find another option when I could not get to the office today!  Trying to balance work, my son's doctor appointment in [place] and taking care of myself can be challenging.  I am sorry to be difficult!!  She was able to develop a plan to get me to a local lab in [place].  Thank you to your nurse for working so hard to get the patients taken care of!!  She is awesome!! Sarah Pratt    You Yesterday (12:11 PM)    Spoke with pt and she is going to [facility] [place] lab to collect her urine. Orders have been placed.       Note    You routed conversation to [name], MD Yesterday (12:10 PM)   Sarah Pratt  P [facility] (supporting [name], MD) Yesterday (11:52 AM)    Hi, Dr Derrel Nip.  I hope you are doing well.  I am having symptoms of a UTI.  I started yesterday with urinary frequency.  Today that continues as well as urgency, discomfort, and pink tinged urine.  Do I need to be  seen or can I get an antibiotic called in? I do not want to put off if I can get it taken care of quickly. I use the [facility] employee pharmacy.  Thank you! Sarah Pratt [phone]

## 2021-10-19 NOTE — Telephone Encounter (Signed)
See telephone encounter.

## 2021-10-19 NOTE — Telephone Encounter (Signed)
Patient called about her lab results from yesterday. Patient advised that the labs are not in. She wants to know if she should go some where else, the pain is getting worse. 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

## 2021-10-19 NOTE — Addendum Note (Signed)
Addended by: [name] on: 10/19/2021 04:11 PM   Modules accepted: Orders

## 2021-10-20 ENCOUNTER — Telehealth: Payer: 59 | Admitting: Internal Medicine

## 2021-10-20 ENCOUNTER — Encounter: Payer: Self-pay | Admitting: Internal Medicine

## 2021-10-20 DIAGNOSIS — N3001 Acute cystitis with hematuria: Secondary | ICD-10-CM | POA: Diagnosis not present

## 2021-10-20 NOTE — Progress Notes (Signed)
Virtual Visit via [place] Note  This visit type was conducted due to national recommendations for restrictions regarding the COVID-19 pandemic (e.g. social distancing).  This format is felt to be most appropriate for this patient at this time.  All issues noted in this document were discussed and addressed.  No physical exam was performed (except for noted visual exam findings with Video Visits).   I connected withNAME@ on 10/20/21 at  4:30 PM EDT by a video enabled telemedicine application  and verified that I am speaking with the correct person using two identifiers. Location patient: home Location provider: work or home office Persons participating in the virtual visit: patient, provider  I discussed the limitations, risks, security and privacy concerns of performing an evaluation and management service by telephone and the availability of in person appointments. I also discussed with the patient that there may be a patient responsible charge related to this service. The patient expressed understanding and agreed to proceed.  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  Started Septra yesterday, symptoms improving.  Hs not had hematuria in 24 hours and the burning has improved as well  She denies any recent swimming or unusual activity but has been "super busy" planning her daughter's graduation festivities for the past 2 weeks and feels that the UTI was a result of decreased water intake.   UA and culture were ordered,  and patient provided a urine sample to [place] but apparently the sample has been lost by [facility]    ROS: See pertinent positives and negatives per HPI.  Past Medical History:  Diagnosis Date   Allergy    Arthritis    Asthma    Chest pain    a. 07/2017 St Echo: EF 65-70%, no rwma. No wall motion abnormalities w/ exercise. Nl study.   Chicken pox    Congenital pectus  excavatum    Depression    Dyspnea on exertion    Mitral valve prolapse    a. 10/2016 Echo: EF 55-60%, no rwma, mild MVP w/ triv MR, nl RV fxn.   PSVT (paroxysmal supraventricular tachycardia) ([place])    a. Prev eval @ [facility] - wishes to avoid RFCA @ this time.    Past Surgical History:  Procedure Laterality Date   COLONOSCOPY WITH PROPOFOL N/A 04/22/2021   Procedure: COLONOSCOPY WITH PROPOFOL;  Surgeon: Lin Landsman, MD;  Location: [place];  Service: Endoscopy;  Laterality: N/A;   FRACTURE SURGERY Right 08/1998   shoulder fracture   SHOULDER SURGERY Right 08/1998, 02/1999, 09/2013    Family History  Problem Relation Age of Onset   Hyperlipidemia Mother    Diabetes Mother 64       late type 2   Atrial fibrillation Mother    Arthritis Father        rheumatoid    Hyperlipidemia Father    Parkinson's disease Father 51       deceased   Multiple sclerosis Sister 16   Rheum arthritis Sister 33   Breast cancer Maternal Aunt        ? 50's   Colon cancer Maternal Grandmother        ? 58's and bladder cancer   Von Willebrand disease Son     SOCIAL HX:  reports that she has never smoked. She has never used smokeless tobacco. She reports that she does not drink alcohol  and does not use drugs.    Current Outpatient Medications:    Ascorbic Acid (VITAMIN C) 1000 MG tablet, Take 1,000 mg by mouth daily., Disp: , Rfl:    Cholecalciferol 25 MCG (1000 UT) capsule, Take 1,000 Units by mouth daily., Disp: , Rfl:    fluticasone (FLONASE) 50 MCG/ACT nasal spray, Place 2 sprays into both nostrils as needed. , Disp: , Rfl:    levonorgestrel (MIRENA) 20 MCG/24HR IUD, by Intrauterine route. , Disp: , Rfl:    loratadine (CLARITIN) 10 MG tablet, Take 10 mg by mouth as needed for allergies., Disp: , Rfl:    metoprolol tartrate (LOPRESSOR) 25 MG tablet, Take by mouth as needed., Disp: , Rfl:    sulfamethoxazole-trimethoprim (BACTRIM DS) 800-160 MG tablet, Take 1 tablet by mouth 2 (two)  times daily., Disp: 10 tablet, Rfl: 0   triamcinolone cream (KENALOG) 0.1 %, Apply twice daily to affected areas until clear, Disp: 15 g, Rfl: 0   zinc gluconate 50 MG tablet, Take 50 mg by mouth daily., Disp: , Rfl:   EXAM:  VITALS per patient if applicable:  GENERAL: alert, oriented, appears well and in no acute distress  HEENT: atraumatic, conjunttiva clear, no obvious abnormalities on inspection of external nose and ears  NECK: normal movements of the head and neck  LUNGS: on inspection no signs of respiratory distress, breathing rate appears normal, no obvious gross SOB, gasping or wheezing  CV: no obvious cyanosis  MS: moves all visible extremities without noticeable abnormality  PSYCH/NEURO: pleasant and cooperative, no obvious depression or anxiety, speech and thought processing grossly intact  ASSESSMENT AND PLAN:  Discussed the following assessment and plan:  Acute cystitis with hematuria  Acute cystitis with hematuria Improving with empiric treatment.  Continue Septra .  Probiotic advised     I discussed the assessment and treatment plan with the patient. The patient was provided an opportunity to ask questions and all were answered. The patient agreed with the plan and demonstrated an understanding of the instructions.   The patient was advised to call back or seek an in-person evaluation if the symptoms worsen or if the condition fails to improve as anticipated.   I spent   20  minutes dedicated to the care of this patient on the date of this encounter to include pre-visit review of hiermedical history,  Face-to-face time with the patient , and post visit ordering of testing and therapeutics.    [name], MD

## 2021-10-20 NOTE — Assessment & Plan Note (Signed)
Improving with empiric treatment.  Continue Septra .  Probiotic advised

## 2022-01-07 DIAGNOSIS — L821 Other seborrheic keratosis: Secondary | ICD-10-CM | POA: Diagnosis not present

## 2022-01-07 DIAGNOSIS — D225 Melanocytic nevi of trunk: Secondary | ICD-10-CM | POA: Diagnosis not present

## 2022-01-07 DIAGNOSIS — D2271 Melanocytic nevi of right lower limb, including hip: Secondary | ICD-10-CM | POA: Diagnosis not present

## 2022-01-07 DIAGNOSIS — D2272 Melanocytic nevi of left lower limb, including hip: Secondary | ICD-10-CM | POA: Diagnosis not present

## 2022-01-07 DIAGNOSIS — D2261 Melanocytic nevi of right upper limb, including shoulder: Secondary | ICD-10-CM | POA: Diagnosis not present

## 2022-01-07 DIAGNOSIS — D2262 Melanocytic nevi of left upper limb, including shoulder: Secondary | ICD-10-CM | POA: Diagnosis not present

## 2022-02-21 ENCOUNTER — Other Ambulatory Visit: Payer: Self-pay

## 2022-02-21 ENCOUNTER — Encounter: Payer: Self-pay | Admitting: Internal Medicine

## 2022-02-21 ENCOUNTER — Ambulatory Visit (INDEPENDENT_AMBULATORY_CARE_PROVIDER_SITE_OTHER): Payer: 59 | Admitting: Internal Medicine

## 2022-02-21 VITALS — BP 100/58 | HR 70 | Temp 97.5°F | Ht 66.0 in | Wt 127.0 lb

## 2022-02-21 DIAGNOSIS — Z1159 Encounter for screening for other viral diseases: Secondary | ICD-10-CM | POA: Insufficient documentation

## 2022-02-21 DIAGNOSIS — Z780 Asymptomatic menopausal state: Secondary | ICD-10-CM | POA: Diagnosis not present

## 2022-02-21 DIAGNOSIS — Z0000 Encounter for general adult medical examination without abnormal findings: Secondary | ICD-10-CM

## 2022-02-21 DIAGNOSIS — Z1231 Encounter for screening mammogram for malignant neoplasm of breast: Secondary | ICD-10-CM

## 2022-02-21 DIAGNOSIS — E785 Hyperlipidemia, unspecified: Secondary | ICD-10-CM | POA: Diagnosis not present

## 2022-02-21 DIAGNOSIS — R634 Abnormal weight loss: Secondary | ICD-10-CM

## 2022-02-21 DIAGNOSIS — F330 Major depressive disorder, recurrent, mild: Secondary | ICD-10-CM

## 2022-02-21 DIAGNOSIS — Z975 Presence of (intrauterine) contraceptive device: Secondary | ICD-10-CM | POA: Diagnosis not present

## 2022-02-21 DIAGNOSIS — Z114 Encounter for screening for human immunodeficiency virus [HIV]: Secondary | ICD-10-CM

## 2022-02-21 DIAGNOSIS — Z23 Encounter for immunization: Secondary | ICD-10-CM

## 2022-02-21 DIAGNOSIS — Z8249 Family history of ischemic heart disease and other diseases of the circulatory system: Secondary | ICD-10-CM | POA: Diagnosis not present

## 2022-02-21 MED ORDER — SERTRALINE HCL 50 MG PO TABS
50.0000 mg | ORAL_TABLET | Freq: Every day | ORAL | 3 refills | Status: DC
  Filled 2022-02-21: qty 30, 30d supply, fill #0
  Filled 2022-03-25: qty 30, 30d supply, fill #1
  Filled 2022-05-11: qty 30, 30d supply, fill #2
  Filled 2022-06-22: qty 30, 30d supply, fill #3

## 2022-02-21 NOTE — Assessment & Plan Note (Signed)
Referring to Dr Garwin Brothers for removal. .

## 2022-02-21 NOTE — Assessment & Plan Note (Signed)
She declines screening for hep c and hiv ;  She donates blood regularly and has been screened elsewhere

## 2022-02-21 NOTE — Assessment & Plan Note (Signed)

## 2022-02-21 NOTE — Progress Notes (Signed)
The patient is here for annual preventive examination and management of other chronic and acute problems.   The risk factors are reflected in the social history.   The roster of all physicians providing medical care to patient - is listed in the Snapshot section of the chart.   Activities of daily living:  The patient is 100% independent in all ADLs: dressing, toileting, feeding as well as independent mobility   Home safety : The patient has smoke detectors in the home. They wear seatbelts.  There are no unsecured firearms at home. There is no violence in the home.    There is no risks for hepatitis, STDs or HIV. There is no   history of blood transfusion. They have no travel history to infectious disease endemic areas of the world.   The patient has seen their dentist in the last six month. They have seen their eye doctor in the last year. The patinet  denies slight hearing difficulty with regard to whispered voices and some television programs.  They have deferred audiologic testing in the last year.  They do not  have excessive sun exposure. Discussed the need for sun protection: hats, long sleeves and use of sunscreen if there is significant sun exposure.    Diet: the importance of a healthy diet is discussed. They do have a healthy diet.   The benefits of regular aerobic exercise were discussed. The patient  exercises  3 to 5 days per week  for  60 minutes.    Depression screen: there are no signs or vegative symptoms of depression- irritability, change in appetite, anhedonia, sadness/tearfullness.   The following portions of the patient's history were reviewed and updated as appropriate: allergies, current medications, past family history, past medical history,  past surgical history, past social history  and problem list.   Visual acuity was not assessed per patient preference since the patient has regular follow up with an  ophthalmologist. Hearing and body mass index were assessed and  reviewed.    During the course of the visit the patient was educated and counseled about appropriate screening and preventive services including : fall prevention , diabetes screening, nutrition counseling, colorectal cancer screening, and recommended immunizations.    Chief Complaint:  1) depression:  aggravated by daughter's reluctance to leave home for college.  Has been losing sleep, irritable,  tearful.  No sex drive.   She and daughter are very very close. Did not toleate wellbutrin.  Review of Symptoms  Patient denies headache, fevers, malaise, unintentional weight loss, skin rash, eye pain, sinus congestion and sinus pain, sore throat, dysphagia,  hemoptysis , cough, dyspnea, wheezing, chest pain, palpitations, orthopnea, edema, abdominal pain, nausea, melena, diarrhea, constipation, flank pain, dysuria, hematuria, urinary  Frequency, nocturia, numbness, tingling, seizures,  Focal weakness, Loss of consciousness,  Tremor, , anxiety, and suicidal ideation.    Physical Exam:  BP (!) 100/58 (BP Location: Left Arm, Patient Position: Sitting, Cuff Size: Normal)   Pulse 70   Temp (!) 97.5 F (36.4 C) (Oral)   Ht '5\' 6"'$  (1.676 m)   Wt 127 lb (57.6 kg)   SpO2 97%   BMI 20.50 kg/m    General appearance: alert, cooperative and appears stated age Head: Normocephalic, without obvious abnormality, atraumatic Eyes: conjunctivae/corneas clear. PERRL, EOM's intact. Fundi benign. Ears: normal TM's and external ear canals both ears Nose: Nares normal. Septum midline. Mucosa normal. No drainage or sinus tenderness. Throat: lips, mucosa, and tongue normal; teeth and gums normal  Neck: no adenopathy, no carotid bruit, no JVD, supple, symmetrical, trachea midline and thyroid not enlarged, symmetric, no tenderness/mass/ Chest: pectus excavatum  Lungs: clear to auscultation bilaterally Breasts: normal appearance, right nipple inversion (chronic), no masses or tenderness Heart: regular rate and  rhythm, S1, S2 normal, no murmur, click, rub or gallop Abdomen: soft, non-tender; bowel sounds normal; no masses,  no organomegaly Extremities: extremities normal, atraumatic, no cyanosis or edema Pulses: 2+ and symmetric Skin: Skin color, texture, turgor normal. No rashes or lesions Neurologic: Alert and oriented X 3, normal strength and tone. Normal symmetric reflexes. Normal coordination and gait.     Assessment and Plan:  Depression Aggravated by daughter's matriculation at [employer].  Starting sertraline (did not tolerate Wellbutrin)  Encounter for preventive health examination age appropriate education and counseling updated, referrals for preventative services and immunizations addressed, dietary and smoking counseling addressed, most recent labs reviewed.  I have personally reviewed and have noted:   1) the patient's medical and social history 2) The pt's use of alcohol, tobacco, and illicit drugs 3) The patient's current medications and supplements 4) Functional ability including ADL's, fall risk, home safety risk, hearing and visual impairment 5) Diet and physical activities 6) Evidence for depression or mood disorder 7) The patient's height, weight, and BMI have been recorded in the chart 8) I have ordered and reviewed a 12 lead EKG and find that there are no acute changes and patient is in sinus rhythm.     I have made referrals, and provided counseling and education based on review of the above  IUD (intrauterine device) in place Referring to Dr Garwin Brothers for removal. .   Menopause Confirmed with [facility]  > 100 2 years ago.  Mirena  IUD in place,  Needs removal.  Reviewed her concerns about developing symptoms after IUD remova .   Family history of blood clots Her mother had recurrent clots in her younger years;  Her son has von Willebrand's .  She screened negative for VWD in 2018   Need for hepatitis C screening test She declines screening for hep c and hiv ;   She donates blood regularly and has been screened elsewhere    Updated Medication List Outpatient Encounter Medications as of 02/21/2022  Medication Sig   Ascorbic Acid (VITAMIN C) 1000 MG tablet Take 1,000 mg by mouth daily.   Cholecalciferol 25 MCG (1000 UT) capsule Take 1,000 Units by mouth daily.   fluticasone (FLONASE) 50 MCG/ACT nasal spray Place 2 sprays into both nostrils as needed.    levonorgestrel (MIRENA) 20 MCG/24HR IUD by Intrauterine route.    loratadine (CLARITIN) 10 MG tablet Take 10 mg by mouth as needed for allergies.   metoprolol tartrate (LOPRESSOR) 25 MG tablet Take by mouth as needed.   sertraline (ZOLOFT) 50 MG tablet Take 1 tablet (50 mg total) by mouth daily.   zinc gluconate 50 MG tablet Take 50 mg by mouth daily.   [DISCONTINUED] sulfamethoxazole-trimethoprim (BACTRIM DS) 800-160 MG tablet Take 1 tablet by mouth 2 (two) times daily.   [DISCONTINUED] triamcinolone cream (KENALOG) 0.1 % Apply twice daily to affected areas until clear   No facility-administered encounter medications on file as of 02/21/2022.

## 2022-02-21 NOTE — Assessment & Plan Note (Signed)
Her mother had recurrent clots in her younger years;  Her son has von Willebrand's .  She screened negative for VWD in 2018

## 2022-02-21 NOTE — Assessment & Plan Note (Signed)
Aggravated by daughter's matriculation at [employer].  Starting sertraline (did not tolerate Wellbutrin)

## 2022-02-21 NOTE — Assessment & Plan Note (Signed)
Confirmed with [facility]  > 100 2 years ago.  Mirena  IUD in place,  Needs removal.  Reviewed her concerns about developing symptoms after IUD remova .

## 2022-02-21 NOTE — Patient Instructions (Addendum)
  I am recommending Dr Servando Salina   for your GYN /IUD removal  Please start the  Sertraline (generic for Zoloft) at 1/2 tablet daily in the morning with breakfast for the first 4-5 days  to avoid nausea.  You can increase to a full tablet after  that if you are tolerating it     You should start to feel a difference after two weeks on the full dose .  YOu may increase the dose from 50 mg to 100 mg after 2 weeks if  not.   Your annual mammogram has been ordered.   [facility] will not allow [place] to schedule it for you,  so please  call to make your appointment [phone]

## 2022-02-22 LAB — CBC WITH DIFFERENTIAL/PLATELET
Basophils Absolute: 0.1 10*3/uL (ref 0.0–0.1)
Basophils Relative: 1.2 % (ref 0.0–3.0)
Eosinophils Absolute: 0.1 10*3/uL (ref 0.0–0.7)
Eosinophils Relative: 2.3 % (ref 0.0–5.0)
HCT: 42.1 % (ref 36.0–46.0)
Hemoglobin: 14.6 g/dL (ref 12.0–15.0)
Lymphocytes Relative: 26.3 % (ref 12.0–46.0)
Lymphs Abs: 1.3 10*3/uL (ref 0.7–4.0)
MCHC: 34.7 g/dL (ref 30.0–36.0)
MCV: 89.2 fl (ref 78.0–100.0)
Monocytes Absolute: 0.4 10*3/uL (ref 0.1–1.0)
Monocytes Relative: 7.9 % (ref 3.0–12.0)
Neutro Abs: 3.1 10*3/uL (ref 1.4–7.7)
Neutrophils Relative %: 62.3 % (ref 43.0–77.0)
Platelets: 181 10*3/uL (ref 150.0–400.0)
RBC: 4.72 Mil/uL (ref 3.87–5.11)
RDW: 12.5 % (ref 11.5–15.5)
WBC: 5 10*3/uL (ref 4.0–10.5)

## 2022-02-22 LAB — COMPREHENSIVE METABOLIC PANEL
ALT: 10 U/L (ref 0–35)
AST: 13 U/L (ref 0–37)
Albumin: 4.3 g/dL (ref 3.5–5.2)
Alkaline Phosphatase: 72 U/L (ref 39–117)
BUN: 17 mg/dL (ref 6–23)
CO2: 31 mEq/L (ref 19–32)
Calcium: 9 mg/dL (ref 8.4–10.5)
Chloride: 102 mEq/L (ref 96–112)
Creatinine, Ser: 0.75 mg/dL (ref 0.40–1.20)
GFR: 94.27 mL/min (ref >60.00)
Glucose, Bld: 85 mg/dL (ref 70–99)
Potassium: 4.2 mEq/L (ref 3.5–5.1)
Sodium: 139 mEq/L (ref 135–145)
Total Bilirubin: 1 mg/dL (ref 0.2–1.2)
Total Protein: 6.6 g/dL (ref 6.0–8.3)

## 2022-02-22 LAB — TSH: TSH: 1.62 u[IU]/mL (ref 0.35–5.50)

## 2022-03-25 ENCOUNTER — Other Ambulatory Visit (HOSPITAL_COMMUNITY): Payer: Self-pay

## 2022-03-29 ENCOUNTER — Encounter (HOSPITAL_COMMUNITY): Payer: Self-pay

## 2022-03-29 ENCOUNTER — Other Ambulatory Visit (HOSPITAL_COMMUNITY): Payer: Self-pay

## 2022-05-25 ENCOUNTER — Ambulatory Visit (INDEPENDENT_AMBULATORY_CARE_PROVIDER_SITE_OTHER): Payer: Commercial Managed Care - PPO | Admitting: Family Medicine

## 2022-05-25 ENCOUNTER — Encounter: Payer: Self-pay | Admitting: Family Medicine

## 2022-05-25 VITALS — BP 111/71 | HR 76 | Ht 66.0 in | Wt 132.0 lb

## 2022-05-25 DIAGNOSIS — Z780 Asymptomatic menopausal state: Secondary | ICD-10-CM

## 2022-05-25 DIAGNOSIS — Z30432 Encounter for removal of intrauterine contraceptive device: Secondary | ICD-10-CM

## 2022-05-25 NOTE — Progress Notes (Signed)
New GYN here to Establish Care.  LMP:Per pt has been in Menopause last 2 yrs.  Last pap:09/05/19 WNL  Contraception:IUD wants removed today  Mammogram:05/26/2021 scheduled for Next week   Hx of Breast Cancer: M.Aunt  STD Screening: Declines  Flu Vaccine : Already received 02/2022 [employer] Employee.   Wants to discuss Menopause   CC: None   Fun Fact:  Gardening and plans of [employer] this summer for the first time.

## 2022-05-25 NOTE — Assessment & Plan Note (Signed)
Discussed usual symptoms, resolution, and possible dampening effect of Mirena. May develop worsening mood or hot flashes and poor mood--this may not occur, and if it does may lessen over time. She will let us know if things seem worse.

## 2022-05-25 NOTE — Progress Notes (Signed)
   Subjective:    Patient ID: Sarah Pratt is a 47 y.o. female presenting with Establish Care  on 05/25/2022  HPI: Patient is here to establish care. She would like to remove her IUD. In for some time and placed for bleeding. No cycles for some time. Has FSH > 100 from 2021. Does not have hot flashes. Does struggle with some depression and poor sleep. She is concerned that removal may impact her mood. She does have vaginal dryness. Mother went through menopause in her late 44s and had terrible hot flashes.  Review of Systems  Constitutional:  Negative for chills and fever.  Respiratory:  Negative for shortness of breath.   Cardiovascular:  Negative for chest pain.  Gastrointestinal:  Negative for abdominal pain, nausea and vomiting.  Genitourinary:  Negative for dysuria.  Skin:  Negative for rash.      Objective:    BP 111/71   Pulse 76   Ht '5\' 6"'$  (1.676 m)   Wt 132 lb (59.9 kg)   BMI 21.31 kg/m  Physical Exam Exam conducted with a chaperone present.  Constitutional:      General: She is not in acute distress.    Appearance: She is well-developed.  HENT:     Head: Normocephalic and atraumatic.  Eyes:     General: No scleral icterus. Cardiovascular:     Rate and Rhythm: Normal rate.  Pulmonary:     Effort: Pulmonary effort is normal.  Abdominal:     Palpations: Abdomen is soft.  Genitourinary:    Comments: BUS normal, vagina is pink and rugated, cervix is parous without lesion, IUD strings noted  Musculoskeletal:     Cervical back: Neck supple.  Skin:    General: Skin is warm and dry.  Neurological:     Mental Status: She is alert and oriented to person, place, and time.     Procedure: Speculum placed inside vagina.  Cervix visualized.  Strings grasped with ring forceps.  IUD removed intact.      Assessment & Plan:   Problem List Items Addressed This Visit       Unprioritized   Menopause    Discussed usual symptoms, resolution, and possible  dampening effect of Mirena. May develop worsening mood or hot flashes and poor mood--this may not occur, and if it does may lessen over time. She will let us know if things seem worse.      Other Visit Diagnoses     Encounter for IUD removal    -  Primary        Return in about 3 months (around 08/24/2022) for a CPE.  Donnamae Jude, MD 05/25/2022 10:27 AM

## 2022-05-30 ENCOUNTER — Ambulatory Visit
Admission: RE | Admit: 2022-05-30 | Discharge: 2022-05-30 | Disposition: A | Discharge status: Home / Self Care | Payer: Commercial Managed Care - PPO | Source: Ambulatory Visit | Attending: Internal Medicine | Admitting: Internal Medicine

## 2022-05-30 DIAGNOSIS — Z1231 Encounter for screening mammogram for malignant neoplasm of breast: Secondary | ICD-10-CM | POA: Diagnosis not present

## 2022-06-01 ENCOUNTER — Other Ambulatory Visit: Payer: Self-pay

## 2022-08-01 ENCOUNTER — Other Ambulatory Visit: Payer: Self-pay | Admitting: Internal Medicine

## 2022-08-02 ENCOUNTER — Other Ambulatory Visit (HOSPITAL_COMMUNITY): Payer: Self-pay

## 2022-08-02 ENCOUNTER — Other Ambulatory Visit: Payer: Self-pay

## 2022-08-02 MED ORDER — SERTRALINE HCL 50 MG PO TABS
50.0000 mg | ORAL_TABLET | Freq: Every day | ORAL | 3 refills | Status: DC
  Filled 2022-08-02: qty 30, 30d supply, fill #0
  Filled 2022-09-14: qty 30, 30d supply, fill #1
  Filled 2022-11-15: qty 30, 30d supply, fill #2
  Filled 2022-12-27: qty 30, 30d supply, fill #3

## 2022-09-15 ENCOUNTER — Other Ambulatory Visit: Payer: Self-pay

## 2022-09-15 ENCOUNTER — Encounter: Payer: Self-pay | Admitting: Pharmacist

## 2022-09-16 ENCOUNTER — Other Ambulatory Visit (HOSPITAL_COMMUNITY): Payer: Self-pay

## 2023-01-13 DIAGNOSIS — L821 Other seborrheic keratosis: Secondary | ICD-10-CM | POA: Diagnosis not present

## 2023-01-13 DIAGNOSIS — D2262 Melanocytic nevi of left upper limb, including shoulder: Secondary | ICD-10-CM | POA: Diagnosis not present

## 2023-01-13 DIAGNOSIS — D2261 Melanocytic nevi of right upper limb, including shoulder: Secondary | ICD-10-CM | POA: Diagnosis not present

## 2023-01-13 DIAGNOSIS — D2272 Melanocytic nevi of left lower limb, including hip: Secondary | ICD-10-CM | POA: Diagnosis not present

## 2023-01-13 DIAGNOSIS — D225 Melanocytic nevi of trunk: Secondary | ICD-10-CM | POA: Diagnosis not present

## 2023-01-13 DIAGNOSIS — D2271 Melanocytic nevi of right lower limb, including hip: Secondary | ICD-10-CM | POA: Diagnosis not present

## 2023-02-15 ENCOUNTER — Other Ambulatory Visit: Payer: Self-pay | Admitting: Internal Medicine

## 2023-02-15 ENCOUNTER — Other Ambulatory Visit: Payer: Self-pay

## 2023-02-16 ENCOUNTER — Other Ambulatory Visit (HOSPITAL_COMMUNITY): Payer: Self-pay

## 2023-02-16 MED ORDER — SERTRALINE HCL 50 MG PO TABS
50.0000 mg | ORAL_TABLET | Freq: Every day | ORAL | 3 refills | Status: DC
  Filled 2023-02-16: qty 30, 30d supply, fill #0
  Filled 2023-03-13: qty 30, 30d supply, fill #1

## 2023-02-24 ENCOUNTER — Encounter: Payer: Self-pay | Admitting: Internal Medicine

## 2023-02-24 ENCOUNTER — Ambulatory Visit (INDEPENDENT_AMBULATORY_CARE_PROVIDER_SITE_OTHER): Payer: Commercial Managed Care - PPO | Admitting: Internal Medicine

## 2023-02-24 ENCOUNTER — Other Ambulatory Visit (HOSPITAL_COMMUNITY)
Admission: RE | Admit: 2023-02-24 | Discharge: 2023-02-24 | Disposition: A | Discharge status: Home / Self Care | Payer: Commercial Managed Care - PPO | Source: Ambulatory Visit | Attending: Internal Medicine | Admitting: Internal Medicine

## 2023-02-24 VITALS — BP 80/60 | HR 96 | Ht 66.0 in | Wt 126.4 lb

## 2023-02-24 DIAGNOSIS — Z124 Encounter for screening for malignant neoplasm of cervix: Secondary | ICD-10-CM | POA: Insufficient documentation

## 2023-02-24 DIAGNOSIS — N939 Abnormal uterine and vaginal bleeding, unspecified: Secondary | ICD-10-CM | POA: Diagnosis not present

## 2023-02-24 DIAGNOSIS — Z1231 Encounter for screening mammogram for malignant neoplasm of breast: Secondary | ICD-10-CM

## 2023-02-24 DIAGNOSIS — R5383 Other fatigue: Secondary | ICD-10-CM

## 2023-02-24 DIAGNOSIS — Z0001 Encounter for general adult medical examination with abnormal findings: Secondary | ICD-10-CM

## 2023-02-24 DIAGNOSIS — Z0000 Encounter for general adult medical examination without abnormal findings: Secondary | ICD-10-CM

## 2023-02-24 DIAGNOSIS — E785 Hyperlipidemia, unspecified: Secondary | ICD-10-CM | POA: Diagnosis not present

## 2023-02-24 NOTE — Patient Instructions (Signed)
Please insert a tampon for the next 24 hours to confirm a menstrual flow  If the labs indicate you are NOT in menopause ,  no further workup will be needed

## 2023-02-24 NOTE — Progress Notes (Unsigned)
Patient ID: Sarah Pratt, female    DOB: [birth year]  Age: 47 y.o. MRN: [id number]  The patient is here for annual preventive examination and management of other chronic and acute problems.   The risk factors are reflected in the social history.   The roster of all physicians providing medical care to patient - is listed in the Snapshot section of the chart.   Activities of daily living:  The patient is 100% independent in all ADLs: dressing, toileting, feeding as well as independent mobility   Home safety : The patient has smoke detectors in the home. They wear seatbelts.  There are no unsecured firearms at home. There is no violence in the home.    There is no risks for hepatitis, STDs or HIV. There is no   history of blood transfusion. They have no travel history to infectious disease endemic areas of the world.   The patient has seen their dentist in the last six month. They have seen their eye doctor in the last year. The patinet  denies slight hearing difficulty with regard to whispered voices and some television programs.  They have deferred audiologic testing in the last year.  They do not  have excessive sun exposure. Discussed the need for sun protection: hats, long sleeves and use of sunscreen if there is significant sun exposure.    Diet: the importance of a healthy diet is discussed. They do have a healthy diet.   The benefits of regular aerobic exercise were discussed. The patient  exercises  3 to 5 days per week  for  60 minutes.    Depression screen: there are no signs or vegative symptoms of depression- irritability, change in appetite, anhedonia, sadness/tearfullness.   The following portions of the patient's history were reviewed and updated as appropriate: allergies, current medications, past family history, past medical history,  past surgical history, past social history  and problem list.   Visual acuity was not assessed per patient preference since the patient has  regular follow up with an  ophthalmologist. Hearing and body mass index were assessed and reviewed.    During the course of the visit the patient was educated and counseled about appropriate screening and preventive services including : fall prevention , diabetes screening, nutrition counseling, colorectal cancer screening, and recommended immunizations.    Chief Complaint:   NO COMPLAINTS .  KIDS DOING WELL .  SON IS 16 .  AND DTR IN COLLEGE YR 2 at [employer].   Sleeping better sincr removing IUD,  has  restricted diet,  no seed oils.  No processed foods.  Feeling the bvest she hsa in a lo g time    T li   Review of Symptoms  Patient denies headache, fevers, malaise, unintentional weight loss, skin rash, eye pain, sinus congestion and sinus pain, sore throat, dysphagia,  hemoptysis , cough, dyspnea, wheezing, chest pain, palpitations, orthopnea, edema, abdominal pain, nausea, melena, diarrhea, constipation, flank pain, dysuria, hematuria, urinary  Frequency, nocturia, numbness, tingling, seizures,  Focal weakness, Loss of consciousness,  Tremor, insomnia, depression, anxiety, and suicidal ideation.    Physical Exam:  BP (!) 80/60   Pulse 96   Ht 5\' 6"  (1.676 m)   Wt 126 lb 6.4 oz (57.3 kg)   SpO2 98%   BMI 20.40 kg/m    Physical Exam Vitals reviewed.  Constitutional:      General: She is not in acute distress.    Appearance: Normal appearance. She is well-developed  and normal weight. She is not ill-appearing, toxic-appearing or diaphoretic.  HENT:     Head: Normocephalic.     Right Ear: Tympanic membrane, ear canal and external ear normal. There is no impacted cerumen.     Left Ear: Tympanic membrane, ear canal and external ear normal. There is no impacted cerumen.     Nose: Nose normal.     Mouth/Throat:     Mouth: Mucous membranes are moist.     Pharynx: Oropharynx is clear.  Eyes:     General: No scleral icterus.       Right eye: No discharge.        Left eye: No  discharge.     Conjunctiva/sclera: Conjunctivae normal.     Pupils: Pupils are equal, round, and reactive to light.  Neck:     Thyroid: No thyromegaly.     Vascular: No carotid bruit or JVD.  Cardiovascular:     Rate and Rhythm: Normal rate and regular rhythm.     Heart sounds: Normal heart sounds.  Pulmonary:     Effort: Pulmonary effort is normal. No respiratory distress.     Breath sounds: Normal breath sounds.  Chest:  Breasts:    Breasts are symmetrical.     Right: Normal. No swelling, inverted nipple, mass, nipple discharge, skin change or tenderness.     Left: Normal. No swelling, inverted nipple, mass, nipple discharge, skin change or tenderness.  Abdominal:     General: Bowel sounds are normal.     Palpations: Abdomen is soft. There is no mass.     Tenderness: There is no abdominal tenderness. There is no guarding or rebound.     Hernia: There is no hernia in the left inguinal area or right inguinal area.  Genitourinary:    Exam position: Lithotomy position.     Pubic Area: No rash or pubic lice.      Labia:        Right: No rash, tenderness, lesion or injury.        Left: No rash, tenderness, lesion or injury.      Vagina: Normal.     Cervix: Normal.     Uterus: Normal.      Adnexa: Right adnexa normal and left adnexa normal.     Comments: Fresh blood in the vault  Musculoskeletal:        General: Normal range of motion.     Cervical back: Normal range of motion and neck supple.  Lymphadenopathy:     Cervical: No cervical adenopathy.     Upper Body:     Right upper body: No supraclavicular, axillary or pectoral adenopathy.     Left upper body: No supraclavicular, axillary or pectoral adenopathy.     Lower Body: No right inguinal adenopathy. No left inguinal adenopathy.  Skin:    General: Skin is warm and dry.  Neurological:     General: No focal deficit present.     Mental Status: She is alert and oriented to person, place, and time. Mental status is at  baseline.  Psychiatric:        Mood and Affect: Mood normal.        Behavior: Behavior normal.        Thought Content: Thought content normal.        Judgment: Judgment normal.    Assessment and Plan: Encounter for general adult medical examination with abnormal findings Assessment & Plan: age appropriate education and counseling updated, referrals for preventative services  and immunizations addressed, dietary and smoking counseling addressed, most recent labs reviewed.  I have personally reviewed and have noted:   1) the patient's medical and social history 2) The pt's use of alcohol, tobacco, and illicit drugs 3) The patient's current medications and supplements 4) Functional ability including ADL's, fall risk, home safety risk, hearing and visual impairment 5) Diet and physical activities 6) Evidence for depression or mood disorder 7) The patient's height, weight, and BMI have been recorded in the chartI have made referrals, and provided counseling and education based on review of the above    Other fatigue -     CBC with Differential/Platelet -     TSH  Hyperlipidemia, unspecified hyperlipidemia type -     Lipid panel -     LDL cholesterol, direct -     Comprehensive metabolic panel -     Hemoglobin A1c  Encounter for screening mammogram for malignant neoplasm of breast -     3D Screening Mammogram, Left and Right; Future  Screening for cervical cancer -     Cytology - PAP  Vaginal bleeding, abnormal -     Cytology - PAP -     FSH/LH; Future  Vaginal bleeding Assessment & Plan: She has been in menopause for over a year,  but had her Mirena iUD removed several months ago .  Today's exam notes a scant amount of fresh blood in the vaginal vault.  Thyroid platelets and lfts are normal.   Rechecking FSH/LH      No follow-ups on file.  Sherlene Shams, MD

## 2023-02-25 LAB — COMPREHENSIVE METABOLIC PANEL
AG Ratio: 1.5 (calc) (ref 1.0–2.5)
ALT: 13 U/L (ref 6–29)
AST: 15 U/L (ref 10–35)
Albumin: 4 g/dL (ref 3.6–5.1)
Alkaline phosphatase (APISO): 80 U/L (ref 31–125)
BUN: 18 mg/dL (ref 7–25)
CO2: 27 mmol/L (ref 20–32)
Calcium: 9.2 mg/dL (ref 8.6–10.2)
Chloride: 103 mmol/L (ref 98–110)
Creat: 0.77 mg/dL (ref 0.50–0.99)
Globulin: 2.7 g/dL (ref 1.9–3.7)
Glucose, Bld: 90 mg/dL (ref 65–99)
Potassium: 4.3 mmol/L (ref 3.5–5.3)
Sodium: 140 mmol/L (ref 135–146)
Total Bilirubin: 1 mg/dL (ref 0.2–1.2)
Total Protein: 6.7 g/dL (ref 6.1–8.1)

## 2023-02-25 LAB — LIPID PANEL
Cholesterol: 186 mg/dL (ref <200)
HDL: 66 mg/dL (ref >=50)
LDL Cholesterol (Calc): 104 mg/dL — ABNORMAL HIGH
Non-HDL Cholesterol (Calc): 120 mg/dL (ref <130)
Total CHOL/HDL Ratio: 2.8 (calc) (ref <5.0)
Triglycerides: 74 mg/dL (ref <150)

## 2023-02-25 LAB — LDL CHOLESTEROL, DIRECT: Direct LDL: 119 mg/dL — ABNORMAL HIGH (ref <100)

## 2023-02-25 LAB — CBC WITH DIFFERENTIAL/PLATELET
Absolute Lymphocytes: 1392 {cells}/uL (ref 850–3900)
Absolute Monocytes: 499 {cells}/uL (ref 200–950)
Basophils Absolute: 58 {cells}/uL (ref 0–200)
Basophils Relative: 1 %
Eosinophils Absolute: 110 {cells}/uL (ref 15–500)
Eosinophils Relative: 1.9 %
HCT: 44.1 % (ref 35.0–45.0)
Hemoglobin: 15 g/dL (ref 11.7–15.5)
MCH: 30.7 pg (ref 27.0–33.0)
MCHC: 34 g/dL (ref 32.0–36.0)
MCV: 90.4 fL (ref 80.0–100.0)
MPV: 11.5 fL (ref 7.5–12.5)
Monocytes Relative: 8.6 %
Neutro Abs: 3741 {cells}/uL (ref 1500–7800)
Neutrophils Relative %: 64.5 %
Platelets: 207 10*3/uL (ref 140–400)
RBC: 4.88 10*6/uL (ref 3.80–5.10)
RDW: 12.1 % (ref 11.0–15.0)
Total Lymphocyte: 24 %
WBC: 5.8 10*3/uL (ref 3.8–10.8)

## 2023-02-25 LAB — TSH: TSH: 1.07 m[IU]/L

## 2023-02-25 LAB — HEMOGLOBIN A1C
Hgb A1c MFr Bld: 5.2 %{Hb} (ref <5.7)
Mean Plasma Glucose: 103 mg/dL
eAG (mmol/L): 5.7 mmol/L

## 2023-02-26 DIAGNOSIS — N939 Abnormal uterine and vaginal bleeding, unspecified: Secondary | ICD-10-CM | POA: Insufficient documentation

## 2023-02-26 NOTE — Assessment & Plan Note (Addendum)
She has been in menopause for over a year,  but had her Mirena iUD removed several months ago .  Today's exam notes a scant amount of fresh blood in the vaginal vault.  Thyroid platelets and lfts are normal.   Rechecking FSH/LH

## 2023-02-26 NOTE — Assessment & Plan Note (Signed)

## 2023-02-27 ENCOUNTER — Encounter: Payer: Self-pay | Admitting: Internal Medicine

## 2023-03-01 ENCOUNTER — Other Ambulatory Visit (INDEPENDENT_AMBULATORY_CARE_PROVIDER_SITE_OTHER): Payer: Commercial Managed Care - PPO

## 2023-03-01 DIAGNOSIS — N939 Abnormal uterine and vaginal bleeding, unspecified: Secondary | ICD-10-CM

## 2023-03-02 ENCOUNTER — Encounter: Payer: Self-pay | Admitting: Internal Medicine

## 2023-03-02 DIAGNOSIS — N950 Postmenopausal bleeding: Secondary | ICD-10-CM

## 2023-03-02 LAB — CYTOLOGY - PAP
Comment: NEGATIVE
High risk HPV: NEGATIVE

## 2023-03-02 LAB — FSH/LH
FSH: 149.8 m[IU]/mL — ABNORMAL HIGH
LH: 62.9 m[IU]/mL

## 2023-03-21 ENCOUNTER — Ambulatory Visit: Payer: Commercial Managed Care - PPO | Admitting: Obstetrics & Gynecology

## 2023-03-21 ENCOUNTER — Other Ambulatory Visit (HOSPITAL_COMMUNITY)
Admission: RE | Admit: 2023-03-21 | Discharge: 2023-03-21 | Disposition: A | Discharge status: Home / Self Care | Payer: Commercial Managed Care - PPO | Source: Ambulatory Visit | Attending: Obstetrics & Gynecology | Admitting: Obstetrics & Gynecology

## 2023-03-21 ENCOUNTER — Other Ambulatory Visit: Payer: Self-pay

## 2023-03-21 VITALS — BP 90/62 | HR 97 | Ht 66.0 in | Wt 127.2 lb

## 2023-03-21 DIAGNOSIS — N870 Mild cervical dysplasia: Secondary | ICD-10-CM

## 2023-03-21 DIAGNOSIS — N941 Unspecified dyspareunia: Secondary | ICD-10-CM | POA: Diagnosis not present

## 2023-03-21 DIAGNOSIS — R87610 Atypical squamous cells of undetermined significance on cytologic smear of cervix (ASC-US): Secondary | ICD-10-CM | POA: Insufficient documentation

## 2023-03-21 DIAGNOSIS — N952 Postmenopausal atrophic vaginitis: Secondary | ICD-10-CM | POA: Diagnosis not present

## 2023-03-21 MED ORDER — IMVEXXY MAINTENANCE PACK 10 MCG VA INST: VAGINAL_INSERT | VAGINAL | 12 refills | Status: DC

## 2023-03-21 MED ORDER — ESTRADIOL 10 MCG VA TABS
10.0000 ug | ORAL_TABLET | VAGINAL | 12 refills | Status: DC
  Filled 2023-03-21: qty 8, 28d supply, fill #0

## 2023-03-21 NOTE — Progress Notes (Signed)
    GYNECOLOGY PROGRESS NOTE  Subjective:    Patient ID: Sarah Pratt, female    DOB: Dec 15, 1975, 47 y.o.   MRN: [id number]  HPI  Patient is a 47 y.o. yo married [id number] here as a new patient to discuss her recent pap result. It showed the following: HIGH RISK HPV (Allen): Negative ADEQUACY: Satisfactory but limited for evaluation with partially obscuring blood; ADEQUACY: transformation zone component present. DIAGNOSIS: - Atrophic pattern with epithelial atypia Abnormal  COMMENT (MOLECULAR): Normal Reference Range HPV - Negative  She gives a h/o LEEP about 23 years ago, normal pap smears since.  She also reports dyspareunia due to atrophy. She is using a lubricant.  The following portions of the patient's history were reviewed and updated as appropriate: allergies, current medications, past family history, past medical history, past social history, past surgical history, and problem list.  Review of Systems Pertinent items are noted in HPI.  She is menopausal, had Mirena IUD removed a few months ago.  Objective:   Blood pressure 90/62, pulse 97, height 5\' 6"  (1.676 m), weight 127 lb 3.2 oz (57.7 kg). Body mass index is 20.53 kg/m. Well nourished, well hydrated White female, no apparent distress She is ambulating and conversing normally. VVA noted Consent signed, time out done Speculum placed. Cervix prepped with acetic acid. Transformation zone seen in its entirety. Colpo adequate. Colposcopic findings normal. There is marked atrophy and some prominent vessels (probably due to atrophy). I obtained a biopsy of the prominent blood vessel at the 6 o'clock position. ECC obtained. She tolerated the procedure well.   Assessment:   1. Atypical squamous cells of undetermined significance (ASCUS) on Papanicolaou smear of cervix     2.     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 Atrophic atypical cells on pap smear - await pathology results   Vaginal estrogen  prescribed

## 2023-03-24 LAB — SURGICAL PATHOLOGY

## 2023-03-27 ENCOUNTER — Encounter: Payer: Self-pay | Admitting: Obstetrics & Gynecology

## 2023-08-09 ENCOUNTER — Ambulatory Visit

## 2023-08-09 ENCOUNTER — Encounter: Payer: Self-pay | Admitting: Internal Medicine

## 2023-08-09 ENCOUNTER — Ambulatory Visit: Admitting: Internal Medicine

## 2023-08-09 VITALS — BP 94/62 | HR 73 | Ht 66.0 in | Wt 133.6 lb

## 2023-08-09 DIAGNOSIS — R058 Other specified cough: Secondary | ICD-10-CM | POA: Diagnosis not present

## 2023-08-09 DIAGNOSIS — R87610 Atypical squamous cells of undetermined significance on cytologic smear of cervix (ASC-US): Secondary | ICD-10-CM | POA: Insufficient documentation

## 2023-08-09 DIAGNOSIS — R091 Pleurisy: Secondary | ICD-10-CM | POA: Diagnosis not present

## 2023-08-09 DIAGNOSIS — Z1231 Encounter for screening mammogram for malignant neoplasm of breast: Secondary | ICD-10-CM

## 2023-08-09 DIAGNOSIS — Q676 Pectus excavatum: Secondary | ICD-10-CM | POA: Diagnosis not present

## 2023-08-09 NOTE — Assessment & Plan Note (Addendum)
 Diagnosis NEVER TURLY MADE.  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

## 2023-08-09 NOTE — Progress Notes (Unsigned)
 Subjective:  Patient ID: Sarah Pratt, female    DOB: [birth year]  Age: 48 y.o. MRN: [id number]  CC: The encounter diagnosis was Encounter for screening mammogram for malignant neoplasm of breast.   HPI Sarah Pratt presents for  Chief Complaint  Patient presents with   Cough   48 yr old female with history of  pleurisy presents with same and cough.  Had fevers , dry cough that lasted 4-5 days occurred 4 week ago.  Symptoms started one day after daughter returned home from college Remained at home , no  COVID test. The cough and pleurisy is improved in the morning but worsens as the day goes on.  Improved from 2 weeks ago .  No shortness of breath..  no recent travel or immobilization.  Some fatigue . HAS BEN USING MUCINEX AFTER SEVERAL DOSES SHE WAS ABLE TO PRODUCE CLEAR SPUTUM.  AND TYLENOL/ALEVE.  ELDERBERRY SYRUP.  Has not been using Claritin or Flonase currently /ALLERGY SYMPTOMS :   ITCHY EYES,  RHINORRHEA   Outpatient Medications Prior to Visit  Medication Sig Dispense Refill   Ascorbic Acid (VITAMIN C) 1000 MG tablet Take 1,000 mg by mouth daily.     Cholecalciferol 25 MCG (1000 UT) capsule Take 1,000 Units by mouth daily.     loratadine (CLARITIN) 10 MG tablet Take 10 mg by mouth as needed for allergies.     metoprolol tartrate (LOPRESSOR) 25 MG tablet Take by mouth as needed.     zinc gluconate 50 MG tablet Take 50 mg by mouth daily.     Estradiol (IMVEXXY MAINTENANCE PACK) 10 MCG INST 1 table per vagina twice weekly at bedtime 8 each 12   Estradiol 10 MCG TABS vaginal tablet Place 1 tablet (10 mcg total) vaginally 2 (two) times a week at bedtime 30 tablet 12   fluticasone (FLONASE) 50 MCG/ACT nasal spray Place 2 sprays into both nostrils as needed.      sertraline (ZOLOFT) 50 MG tablet Take 1 tablet (50 mg total) by mouth daily. 30 tablet 3   No facility-administered medications prior to visit.    Review of Systems;  Patient denies headache, fevers, malaise,  unintentional weight loss, skin rash, eye pain, sinus congestion and sinus pain, sore throat, dysphagia,  hemoptysis , cough, dyspnea, wheezing, chest pain, palpitations, orthopnea, edema, abdominal pain, nausea, melena, diarrhea, constipation, flank pain, dysuria, hematuria, urinary  Frequency, nocturia, numbness, tingling, seizures,  Focal weakness, Loss of consciousness,  Tremor, insomnia, depression, anxiety, and suicidal ideation.      Objective:  BP 94/62   Pulse 73   Ht 5\' 6"  (1.676 m)   Wt 133 lb 9.6 oz (60.6 kg)   SpO2 98%   BMI 21.56 kg/m   BP Readings from Last 3 Encounters:  08/09/23 94/62  03/21/23 90/62  02/24/23 (!) 80/60    Wt Readings from Last 3 Encounters:  08/09/23 133 lb 9.6 oz (60.6 kg)  03/21/23 127 lb 3.2 oz (57.7 kg)  02/24/23 126 lb 6.4 oz (57.3 kg)    Physical Exam  Lab Results  Component Value Date   HGBA1C 5.2 02/24/2023    Lab Results  Component Value Date   CREATININE 0.77 02/24/2023   CREATININE 0.75 02/21/2022   CREATININE 0.62 02/19/2021    Lab Results  Component Value Date   WBC 5.8 02/24/2023   HGB 15.0 02/24/2023   HCT 44.1 02/24/2023   PLT 207 02/24/2023   GLUCOSE 90 02/24/2023   CHOL 186  02/24/2023   TRIG 74 02/24/2023   HDL 66 02/24/2023   LDLDIRECT 119 (H) 02/24/2023   LDLCALC 104 (H) 02/24/2023   ALT 13 02/24/2023   AST 15 02/24/2023   NA 140 02/24/2023   K 4.3 02/24/2023   CL 103 02/24/2023   CREATININE 0.77 02/24/2023   BUN 18 02/24/2023   CO2 27 02/24/2023   TSH 1.07 02/24/2023   HGBA1C 5.2 02/24/2023    No results found.  Assessment & Plan:  .Encounter for screening mammogram for malignant neoplasm of breast     I spent 34 minutes on the day of this face to face encounter reviewing patient's  most recent visit with cardiology,  nephrology,  and neurology,  prior relevant surgical and non surgical procedures, recent  labs and imaging studies, counseling on weight management,  reviewing the assessment  and plan with patient, and post visit ordering and reviewing of  diagnostics and therapeutics with patient  .   Follow-up: No follow-ups on file.   Sherlene Shams, MD

## 2023-08-09 NOTE — Patient Instructions (Signed)
 Ruling out atypical pneumonia with today's film  If normal,  I recommend increasing the aleve to 2 tablets every 12 hours for one week and resuming flonase or astepro nasals sprays for the allergy symptoms  Hold off on mammogram until I speak with a breast surgeon about options  CIN I was seen on colpo in November .  If you want a second opinion,  I recommend and can refer to Maxie Better, MD at [facility] in [place]

## 2023-08-09 NOTE — Assessment & Plan Note (Signed)
 Referred to GYN.  Colposcopy normal.  Vaginal estrogen prescribed [facility] Nov 2024)

## 2023-08-10 ENCOUNTER — Encounter: Payer: Self-pay | Admitting: Internal Medicine

## 2023-08-13 ENCOUNTER — Encounter: Payer: Self-pay | Admitting: Internal Medicine

## 2023-08-14 DIAGNOSIS — Z803 Family history of malignant neoplasm of breast: Secondary | ICD-10-CM | POA: Diagnosis not present

## 2023-08-14 DIAGNOSIS — Q676 Pectus excavatum: Secondary | ICD-10-CM | POA: Diagnosis not present

## 2023-08-14 DIAGNOSIS — R9230 Dense breasts, unspecified: Secondary | ICD-10-CM | POA: Diagnosis not present

## 2023-08-15 ENCOUNTER — Other Ambulatory Visit: Payer: Self-pay | Admitting: General Surgery

## 2023-08-15 DIAGNOSIS — R9230 Dense breasts, unspecified: Secondary | ICD-10-CM

## 2023-08-15 DIAGNOSIS — Q676 Pectus excavatum: Secondary | ICD-10-CM

## 2023-08-15 DIAGNOSIS — Z803 Family history of malignant neoplasm of breast: Secondary | ICD-10-CM

## 2023-11-23 ENCOUNTER — Inpatient Hospital Stay: Admission: RE | Admit: 2023-11-23 | Source: Ambulatory Visit

## 2024-01-19 ENCOUNTER — Other Ambulatory Visit: Payer: Self-pay

## 2024-01-19 DIAGNOSIS — L814 Other melanin hyperpigmentation: Secondary | ICD-10-CM | POA: Diagnosis not present

## 2024-01-19 DIAGNOSIS — L300 Nummular dermatitis: Secondary | ICD-10-CM | POA: Diagnosis not present

## 2024-01-19 DIAGNOSIS — L821 Other seborrheic keratosis: Secondary | ICD-10-CM | POA: Diagnosis not present

## 2024-01-19 MED ORDER — TRIAMCINOLONE ACETONIDE 0.1 % EX CREA
TOPICAL_CREAM | Freq: Two times a day (BID) | CUTANEOUS | 1 refills | Status: AC
  Filled 2024-01-19: qty 30, 15d supply, fill #0

## 2024-01-29 ENCOUNTER — Other Ambulatory Visit: Payer: Self-pay

## 2024-02-28 ENCOUNTER — Encounter: Payer: Self-pay | Admitting: Internal Medicine

## 2024-02-28 ENCOUNTER — Ambulatory Visit: Payer: Commercial Managed Care - PPO | Admitting: Internal Medicine

## 2024-02-28 VITALS — BP 90/64 | HR 83 | Ht 66.0 in | Wt 132.0 lb

## 2024-02-28 DIAGNOSIS — Z780 Asymptomatic menopausal state: Secondary | ICD-10-CM

## 2024-02-28 DIAGNOSIS — R7301 Impaired fasting glucose: Secondary | ICD-10-CM | POA: Diagnosis not present

## 2024-02-28 DIAGNOSIS — R5383 Other fatigue: Secondary | ICD-10-CM

## 2024-02-28 DIAGNOSIS — E782 Mixed hyperlipidemia: Secondary | ICD-10-CM

## 2024-02-28 DIAGNOSIS — Z0000 Encounter for general adult medical examination without abnormal findings: Secondary | ICD-10-CM

## 2024-02-28 DIAGNOSIS — N951 Menopausal and female climacteric states: Secondary | ICD-10-CM | POA: Insufficient documentation

## 2024-02-28 DIAGNOSIS — N952 Postmenopausal atrophic vaginitis: Secondary | ICD-10-CM

## 2024-02-28 DIAGNOSIS — E538 Deficiency of other specified B group vitamins: Secondary | ICD-10-CM

## 2024-02-28 DIAGNOSIS — E559 Vitamin D deficiency, unspecified: Secondary | ICD-10-CM

## 2024-02-28 DIAGNOSIS — R87610 Atypical squamous cells of undetermined significance on cytologic smear of cervix (ASC-US): Secondary | ICD-10-CM

## 2024-02-28 LAB — COMPREHENSIVE METABOLIC PANEL WITH GFR
ALT: 14 U/L (ref 0–35)
AST: 16 U/L (ref 0–37)
Albumin: 4.7 g/dL (ref 3.5–5.2)
Alkaline Phosphatase: 77 U/L (ref 39–117)
BUN: 15 mg/dL (ref 6–23)
CO2: 32 meq/L (ref 19–32)
Calcium: 9.4 mg/dL (ref 8.4–10.5)
Chloride: 100 meq/L (ref 96–112)
Creatinine, Ser: 0.67 mg/dL (ref 0.40–1.20)
GFR: 102.04 mL/min (ref >60.00)
Glucose, Bld: 84 mg/dL (ref 70–99)
Potassium: 4.6 meq/L (ref 3.5–5.1)
Sodium: 140 meq/L (ref 135–145)
Total Bilirubin: 1.6 mg/dL — ABNORMAL HIGH (ref 0.2–1.2)
Total Protein: 7.1 g/dL (ref 6.0–8.3)

## 2024-02-28 LAB — LIPID PANEL
Cholesterol: 217 mg/dL — ABNORMAL HIGH (ref 0–200)
HDL: 66.1 mg/dL (ref >39.00)
LDL Cholesterol: 134 mg/dL — ABNORMAL HIGH (ref 0–99)
NonHDL: 150.6
Total CHOL/HDL Ratio: 3
Triglycerides: 83 mg/dL (ref 0.0–149.0)
VLDL: 16.6 mg/dL (ref 0.0–40.0)

## 2024-02-28 LAB — CBC WITH DIFFERENTIAL/PLATELET
Basophils Absolute: 0 K/uL (ref 0.0–0.1)
Basophils Relative: 0.8 % (ref 0.0–3.0)
Eosinophils Absolute: 0.1 K/uL (ref 0.0–0.7)
Eosinophils Relative: 2.2 % (ref 0.0–5.0)
HCT: 45.3 % (ref 36.0–46.0)
Hemoglobin: 15.5 g/dL — ABNORMAL HIGH (ref 12.0–15.0)
Lymphocytes Relative: 29.5 % (ref 12.0–46.0)
Lymphs Abs: 1.6 K/uL (ref 0.7–4.0)
MCHC: 34.2 g/dL (ref 30.0–36.0)
MCV: 88 fl (ref 78.0–100.0)
Monocytes Absolute: 0.4 K/uL (ref 0.1–1.0)
Monocytes Relative: 7.6 % (ref 3.0–12.0)
Neutro Abs: 3.2 K/uL (ref 1.4–7.7)
Neutrophils Relative %: 59.9 % (ref 43.0–77.0)
Platelets: 205 K/uL (ref 150.0–400.0)
RBC: 5.14 Mil/uL — ABNORMAL HIGH (ref 3.87–5.11)
RDW: 12.8 % (ref 11.5–15.5)
WBC: 5.3 K/uL (ref 4.0–10.5)

## 2024-02-28 LAB — VITAMIN D 25 HYDROXY (VIT D DEFICIENCY, FRACTURES): VITD: 23.74 ng/mL — ABNORMAL LOW (ref 30.00–100.00)

## 2024-02-28 LAB — TSH: TSH: 1.7 u[IU]/mL (ref 0.35–5.50)

## 2024-02-28 LAB — LDL CHOLESTEROL, DIRECT: Direct LDL: 141 mg/dL

## 2024-02-28 LAB — B12 AND FOLATE PANEL
Folate: 7.6 ng/mL (ref >5.9)
Vitamin B-12: 246 pg/mL (ref 211–911)

## 2024-02-28 NOTE — Assessment & Plan Note (Signed)
 Referring to GYN fr annual follow up

## 2024-02-28 NOTE — Assessment & Plan Note (Addendum)
 Symptoms are mild,  and aggravated by insomnia.  She prefers to avoid HRT due to maternal history of DVT /BRCA

## 2024-02-28 NOTE — Assessment & Plan Note (Signed)
 Discussed natural remedies for insomnia including Relaxium   Reviewd principles of good sleep hygiene

## 2024-02-28 NOTE — Assessment & Plan Note (Signed)
 She has decined vaginal estrogen

## 2024-02-28 NOTE — Progress Notes (Signed)
 Patient ID: Sarah Pratt, female    DOB: [birth year]  Age: 48 y.o. MRN: [id number]  The patient is here for annual preventive examination and management of other chronic and acute problems.   The risk factors are reflected in the social history.   The roster of all physicians providing medical care to patient - is listed in the Snapshot section of the chart.   Activities of daily living:  The patient is 100% independent in all ADLs: dressing, toileting, feeding as well as independent mobility   Home safety : The patient has smoke detectors in the home. They wear seatbelts.  There are no unsecured firearms at home. There is no violence in the home.    There is no risks for hepatitis, STDs or HIV. There is no   history of blood transfusion. They have no travel history to infectious disease endemic areas of the world.   The patient has seen their dentist in the last six month. They have seen their eye doctor in the last year. The patinet  denies slight hearing difficulty with regard to whispered voices and some television programs.  They have deferred audiologic testing in the last year.  They do not  have excessive sun exposure. Discussed the need for sun protection: hats, long sleeves and use of sunscreen if there is significant sun exposure.    Diet: the importance of a healthy diet is discussed. They do have a healthy diet.   The benefits of regular aerobic exercise were discussed. The patient  exercises  3 to 5 days per week  for  60 minutes.    Depression screen: there are no signs or vegative symptoms of depression- irritability, change in appetite, anhedonia, sadness/tearfullness.   The following portions of the patient's history were reviewed and updated as appropriate: allergies, current medications, past family history, past medical history,  past surgical history, past social history  and problem list.   Visual acuity was not assessed per patient preference since the patient has  regular follow up with an  ophthalmologist. Hearing and body mass index were assessed and reviewed.    During the course of the visit the patient was educated and counseled about appropriate screening and preventive services including : fall prevention , diabetes screening, nutrition counseling, colorectal cancer screening, and recommended immunizations.    Chief Complaint:    Menopause symptoms  mild, described as brain fog,  weight gain, forgetfulness. Not sleeping well    Review of Symptoms  Patient denies headache, fevers, malaise, unintentional weight loss, skin rash, eye pain, sinus congestion and sinus pain, sore throat, dysphagia,  hemoptysis , cough, dyspnea, wheezing, chest pain, palpitations, orthopnea, edema, abdominal pain, nausea, melena, diarrhea, constipation, flank pain, dysuria, hematuria, urinary  Frequency, nocturia, numbness, tingling, seizures,  Focal weakness, Loss of consciousness,  Tremor, insomnia, depression, anxiety, and suicidal ideation.    Physical Exam:  BP 90/64   Pulse 83   Ht 5' 6 (1.676 m)   Wt 132 lb (59.9 kg)   SpO2 99%   BMI 21.31 kg/m    Physical Exam Vitals reviewed.  Constitutional:      General: She is not in acute distress.    Appearance: Normal appearance. She is well-developed and normal weight. She is not ill-appearing, toxic-appearing or diaphoretic.  HENT:     Head: Normocephalic.     Right Ear: Tympanic membrane, ear canal and external ear normal. There is no impacted cerumen.     Left Ear: Tympanic membrane,  ear canal and external ear normal. There is no impacted cerumen.     Nose: Nose normal.     Mouth/Throat:     Mouth: Mucous membranes are moist.     Pharynx: Oropharynx is clear.  Eyes:     General: No scleral icterus.       Right eye: No discharge.        Left eye: No discharge.     Conjunctiva/sclera: Conjunctivae normal.     Pupils: Pupils are equal, round, and reactive to light.  Neck:     Thyroid : No thyromegaly.      Vascular: No carotid bruit or JVD.  Cardiovascular:     Rate and Rhythm: Normal rate and regular rhythm.     Heart sounds: Normal heart sounds.  Pulmonary:     Effort: Pulmonary effort is normal. No respiratory distress.     Breath sounds: Normal breath sounds.  Chest:     Chest wall: Deformity present.  Breasts:    Breasts are symmetrical.     Right: Normal. No swelling, inverted nipple, mass, nipple discharge, skin change or tenderness.     Left: Normal. No swelling, inverted nipple, mass, nipple discharge, skin change or tenderness.     Comments: Pectus excavatum Abdominal:     General: Bowel sounds are normal.     Palpations: Abdomen is soft. There is no mass.     Tenderness: There is no abdominal tenderness. There is no guarding or rebound.  Musculoskeletal:        General: Normal range of motion.     Cervical back: Normal range of motion and neck supple.  Lymphadenopathy:     Cervical: No cervical adenopathy.     Upper Body:     Right upper body: No supraclavicular, axillary or pectoral adenopathy.     Left upper body: No supraclavicular, axillary or pectoral adenopathy.  Skin:    General: Skin is warm and dry.  Neurological:     General: No focal deficit present.     Mental Status: She is alert and oriented to person, place, and time. Mental status is at baseline.  Psychiatric:        Mood and Affect: Mood normal.        Behavior: Behavior normal.        Thought Content: Thought content normal.        Judgment: Judgment normal.     Assessment and Plan: ASCUS of cervix with negative high risk HPV Assessment & Plan: Referring to GYN fr annual follow up   Orders: -     Ambulatory referral to Gynecology  Vitamin D  deficiency -     VITAMIN D  25 Hydroxy (Vit-D Deficiency, Fractures)  Mixed hyperlipidemia -     Comprehensive metabolic panel with GFR -     Lipid panel -     LDL cholesterol, direct  Other fatigue -     TSH -     CBC with  Differential/Platelet -     B12 and Folate Panel  Impaired fasting glucose  Well woman exam (no gynecological exam) Assessment & Plan: age appropriate education and counseling updated, referrals for preventative services and immunizations addressed, dietary and smoking counseling addressed, most recent labs reviewed.  I have personally reviewed and have noted:   1) the patient's medical and social history 2) The pt's use of alcohol, tobacco, and illicit drugs 3) The patient's current medications and supplements 4) Functional ability including ADL's, fall risk, home safety risk,  hearing and visual impairment 5) Diet and physical activities 6) Evidence for depression or mood disorder 7) The patient's height, weight, and BMI have been recorded in the chart     I have made referrals, and provided counseling and education based on review of the above    Menopause Assessment & Plan: Symptoms are mild,  and aggravated by insomnia.  She prefers to avoid HRT due to maternal history of DVT /BRCA   Vaginal atrophy Assessment & Plan: She has decined vaginal estrogen   Insomnia associated with menopause Assessment & Plan: Discussed natural remedies for insomnia including Relaxium   Reviewd principles of good sleep hygiene      No follow-ups on file.  Verneita LITTIE Kettering, MD

## 2024-02-28 NOTE — Patient Instructions (Addendum)
  For the insomnia:    try using Relaxium for insomnia  (as seen on TV commercials) . It contains:  Melatonin 5 mg  Chamomile 25 mg Passionflower extract 75 mg GABA 100 mg Ashwaganda extract 125 mg Magnesium citrate, glycinate, oxide (100 mg)  L tryptophan 500 mg Valerest (proprietary  ingredient ; probably valeria root extract)   Give it a few weeks trial.  If the max dose doesn't help,  I will send in an rx for trazodone    recommend getting the majority of your calcium and Vitamin D   through diet rather than supplements given the recent association of calcium supplements with increased coronary artery calcium scores  You need 1200 mg calcium daily.  Vit D level to be checked today   Referral to Bernarda Perfect at [facility] OB[facility] in progress

## 2024-02-28 NOTE — Assessment & Plan Note (Signed)

## 2024-02-29 ENCOUNTER — Ambulatory Visit: Payer: Self-pay | Admitting: Internal Medicine

## 2024-02-29 DIAGNOSIS — E538 Deficiency of other specified B group vitamins: Secondary | ICD-10-CM | POA: Insufficient documentation

## 2024-02-29 MED ORDER — ERGOCALCIFEROL 1.25 MG (50000 UT) PO CAPS
50000.0000 [IU] | ORAL_CAPSULE | ORAL | 0 refills | Status: AC
  Filled 2024-02-29: qty 12, 84d supply, fill #0

## 2024-02-29 NOTE — Addendum Note (Signed)
 Addended by: MARYLYNN VERNEITA CROME on: 02/29/2024 10:44 PM   Modules accepted: Orders

## 2024-03-01 ENCOUNTER — Other Ambulatory Visit: Payer: Self-pay

## 2024-03-07 ENCOUNTER — Ambulatory Visit
Admission: RE | Admit: 2024-03-07 | Discharge: 2024-03-07 | Disposition: A | Discharge status: Home / Self Care | Source: Ambulatory Visit | Attending: Internal Medicine | Admitting: Internal Medicine

## 2024-03-07 DIAGNOSIS — Z1231 Encounter for screening mammogram for malignant neoplasm of breast: Secondary | ICD-10-CM | POA: Insufficient documentation

## 2024-03-08 ENCOUNTER — Other Ambulatory Visit

## 2024-03-08 DIAGNOSIS — E538 Deficiency of other specified B group vitamins: Secondary | ICD-10-CM | POA: Diagnosis not present

## 2024-03-10 LAB — INTRINSIC FACTOR ANTIBODIES: Intrinsic Factor Abs, Serum: 1.1 [AU]/ml (ref 0.0–1.1)

## 2024-03-11 ENCOUNTER — Encounter: Payer: Self-pay | Admitting: Internal Medicine

## 2024-03-11 NOTE — Addendum Note (Signed)
 Addended by: MARYLYNN VERNEITA CROME on: 03/11/2024 09:36 AM   Modules accepted: Orders

## 2024-03-13 ENCOUNTER — Other Ambulatory Visit: Payer: Self-pay

## 2024-04-08 ENCOUNTER — Telehealth: Payer: Self-pay

## 2024-04-08 NOTE — Telephone Encounter (Signed)
 Copied from CRM #[id number]. Topic: Appointments - Scheduling Inquiry for Clinic >> Apr 08, 2024 12:39 PM Larissa S wrote: Reason for CRM: Patient would like to know if she can have her labs completed at [facility] instead of [facility] [place] location.

## 2024-04-09 ENCOUNTER — Ambulatory Visit: Admitting: Obstetrics & Gynecology

## 2024-04-09 ENCOUNTER — Other Ambulatory Visit (INDEPENDENT_AMBULATORY_CARE_PROVIDER_SITE_OTHER)

## 2024-04-09 ENCOUNTER — Other Ambulatory Visit (HOSPITAL_COMMUNITY)
Admission: RE | Admit: 2024-04-09 | Discharge: 2024-04-09 | Disposition: A | Source: Ambulatory Visit | Attending: Obstetrics & Gynecology | Admitting: Obstetrics & Gynecology

## 2024-04-09 ENCOUNTER — Encounter: Payer: Self-pay | Admitting: Obstetrics & Gynecology

## 2024-04-09 VITALS — BP 108/72 | HR 84 | Ht 66.0 in | Wt 135.3 lb

## 2024-04-09 DIAGNOSIS — E538 Deficiency of other specified B group vitamins: Secondary | ICD-10-CM | POA: Diagnosis not present

## 2024-04-09 DIAGNOSIS — Z124 Encounter for screening for malignant neoplasm of cervix: Secondary | ICD-10-CM

## 2024-04-09 NOTE — Progress Notes (Signed)
    GYNECOLOGY PROGRESS NOTE  Subjective:    Patient ID: Sarah Pratt, female    DOB: [birth year], 48 y.o.   MRN: [id number]  HPI  Patient is a 48 y.o. married G2P2002 (17 and 33 yo kids) here for a follow up pap smear. She reports that she recently had an annual physical exam with her primary care provider. She reports a recent normal pap smear.  The following portions of the patient's history were reviewed and updated as appropriate: allergies, current medications, past family history, past medical history, past social history, past surgical history, and problem list.  Review of Systems Pertinent items are noted in HPI.  She and her husband are sexually active and she uses a lubricant.  Objective:   Blood pressure 108/72, pulse 84, height 5' 6 (1.676 m), weight 135 lb 4.8 oz (61.4 kg). Body mass index is 21.84 kg/m.   Assessment:   1. Screening for cervical cancer      Plan:   1. Screening for cervical cancer (Primary)  - Cytology - PAP

## 2024-04-10 LAB — VITAMIN B12: Vitamin B-12: >1500 pg/mL — ABNORMAL HIGH (ref 211–911)

## 2024-04-10 NOTE — Telephone Encounter (Signed)
Lab was drawn yesterday

## 2024-04-12 LAB — CYTOLOGY - PAP
Comment: NEGATIVE
Diagnosis: NEGATIVE
High risk HPV: NEGATIVE

## 2024-04-12 NOTE — Telephone Encounter (Signed)
 Noted

## 2025-02-28 ENCOUNTER — Encounter: Admitting: Internal Medicine
